# Patient Record
Sex: Female | Born: 1945
Health system: Southern US, Community
[De-identification: ages and names within clinical notes are randomized; demographics above are authoritative.]

## PROBLEM LIST (undated history)

## (undated) DIAGNOSIS — E785 Hyperlipidemia, unspecified: Secondary | ICD-10-CM

## (undated) DIAGNOSIS — T7840XA Allergy, unspecified, initial encounter: Secondary | ICD-10-CM

## (undated) DIAGNOSIS — Z8619 Personal history of other infectious and parasitic diseases: Secondary | ICD-10-CM

## (undated) DIAGNOSIS — D649 Anemia, unspecified: Secondary | ICD-10-CM

## (undated) DIAGNOSIS — K219 Gastro-esophageal reflux disease without esophagitis: Secondary | ICD-10-CM

## (undated) DIAGNOSIS — H269 Unspecified cataract: Secondary | ICD-10-CM

## (undated) HISTORY — PX: UPPER GI ENDOSCOPY: SHX6162

## (undated) HISTORY — DX: Gastro-esophageal reflux disease without esophagitis: K21.9

## (undated) HISTORY — PX: CATARACT EXTRACTION, BILATERAL: SHX1313

## (undated) HISTORY — DX: Hyperlipidemia, unspecified: E78.5

## (undated) HISTORY — DX: Allergy, unspecified, initial encounter: T78.40XA

## (undated) HISTORY — DX: Anemia, unspecified: D64.9

## (undated) HISTORY — DX: Personal history of other infectious and parasitic diseases: Z86.19

## (undated) HISTORY — DX: Unspecified cataract: H26.9

---

## 1967-10-31 HISTORY — PX: APPENDECTOMY: SHX54

## 1990-10-30 HISTORY — PX: TUBAL LIGATION: SHX77

## 1993-10-30 HISTORY — PX: OVARIAN CYST REMOVAL: SHX89

## 1999-08-15 ENCOUNTER — Other Ambulatory Visit: Admission: RE | Admit: 1999-08-15 | Discharge: 1999-08-15 | Payer: Self-pay | Admitting: Gynecology

## 1999-10-31 HISTORY — PX: BREAST BIOPSY: SHX20

## 2000-06-22 ENCOUNTER — Ambulatory Visit (HOSPITAL_COMMUNITY): Admission: RE | Admit: 2000-06-22 | Discharge: 2000-06-22 | Payer: Self-pay | Admitting: Internal Medicine

## 2000-06-22 ENCOUNTER — Encounter: Payer: Self-pay | Admitting: Internal Medicine

## 2000-08-06 ENCOUNTER — Encounter
Admission: RE | Admit: 2000-08-06 | Discharge: 2000-09-04 | Payer: Self-pay | Admitting: Physical Medicine & Rehabilitation

## 2000-08-23 ENCOUNTER — Other Ambulatory Visit: Admission: RE | Admit: 2000-08-23 | Discharge: 2000-08-23 | Payer: Self-pay | Admitting: Gynecology

## 2000-10-17 ENCOUNTER — Encounter: Payer: Self-pay | Admitting: General Surgery

## 2000-10-17 ENCOUNTER — Encounter: Admission: RE | Admit: 2000-10-17 | Discharge: 2000-10-17 | Payer: Self-pay | Admitting: General Surgery

## 2000-10-19 ENCOUNTER — Ambulatory Visit (HOSPITAL_BASED_OUTPATIENT_CLINIC_OR_DEPARTMENT_OTHER): Admission: RE | Admit: 2000-10-19 | Discharge: 2000-10-19 | Payer: Self-pay | Admitting: General Surgery

## 2000-10-19 ENCOUNTER — Encounter (INDEPENDENT_AMBULATORY_CARE_PROVIDER_SITE_OTHER): Payer: Self-pay | Admitting: *Deleted

## 2001-08-26 ENCOUNTER — Other Ambulatory Visit: Admission: RE | Admit: 2001-08-26 | Discharge: 2001-08-26 | Payer: Self-pay | Admitting: Gynecology

## 2002-08-27 ENCOUNTER — Other Ambulatory Visit: Admission: RE | Admit: 2002-08-27 | Discharge: 2002-08-27 | Payer: Self-pay | Admitting: Gynecology

## 2003-09-10 ENCOUNTER — Other Ambulatory Visit: Admission: RE | Admit: 2003-09-10 | Discharge: 2003-09-10 | Payer: Self-pay | Admitting: Gynecology

## 2004-09-14 ENCOUNTER — Other Ambulatory Visit: Admission: RE | Admit: 2004-09-14 | Discharge: 2004-09-14 | Payer: Self-pay | Admitting: Gynecology

## 2005-09-15 ENCOUNTER — Other Ambulatory Visit: Admission: RE | Admit: 2005-09-15 | Discharge: 2005-09-15 | Payer: Self-pay | Admitting: Gynecology

## 2006-06-04 ENCOUNTER — Ambulatory Visit: Payer: Self-pay | Admitting: Internal Medicine

## 2006-06-26 ENCOUNTER — Ambulatory Visit: Payer: Self-pay | Admitting: Internal Medicine

## 2006-09-17 ENCOUNTER — Other Ambulatory Visit: Admission: RE | Admit: 2006-09-17 | Discharge: 2006-09-17 | Payer: Self-pay | Admitting: Gynecology

## 2007-09-23 ENCOUNTER — Other Ambulatory Visit: Admission: RE | Admit: 2007-09-23 | Discharge: 2007-09-23 | Payer: Self-pay | Admitting: Gynecology

## 2007-12-11 ENCOUNTER — Ambulatory Visit: Payer: Self-pay | Admitting: Internal Medicine

## 2007-12-26 ENCOUNTER — Encounter: Payer: Self-pay | Admitting: Internal Medicine

## 2007-12-26 ENCOUNTER — Ambulatory Visit: Payer: Self-pay | Admitting: Internal Medicine

## 2008-09-30 ENCOUNTER — Encounter: Payer: Self-pay | Admitting: Gynecology

## 2008-09-30 ENCOUNTER — Other Ambulatory Visit: Admission: RE | Admit: 2008-09-30 | Discharge: 2008-09-30 | Payer: Self-pay | Admitting: Gynecology

## 2008-09-30 ENCOUNTER — Ambulatory Visit: Payer: Self-pay | Admitting: Gynecology

## 2009-01-26 ENCOUNTER — Ambulatory Visit: Payer: Self-pay | Admitting: Gynecology

## 2009-04-09 ENCOUNTER — Ambulatory Visit: Payer: Self-pay | Admitting: Cardiovascular Disease

## 2009-04-09 ENCOUNTER — Encounter (INDEPENDENT_AMBULATORY_CARE_PROVIDER_SITE_OTHER): Payer: Self-pay | Admitting: *Deleted

## 2009-04-09 ENCOUNTER — Ambulatory Visit: Payer: Self-pay | Admitting: Internal Medicine

## 2009-04-09 ENCOUNTER — Telehealth: Payer: Self-pay | Admitting: Internal Medicine

## 2009-04-09 DIAGNOSIS — M4120 Other idiopathic scoliosis, site unspecified: Secondary | ICD-10-CM | POA: Insufficient documentation

## 2009-04-09 DIAGNOSIS — M810 Age-related osteoporosis without current pathological fracture: Secondary | ICD-10-CM | POA: Insufficient documentation

## 2009-04-09 LAB — CONVERTED CEMR LAB
BUN: 14 mg/dL (ref 6–23)
Creatinine, Ser: 0.7 mg/dL (ref 0.4–1.2)

## 2009-04-12 ENCOUNTER — Encounter (INDEPENDENT_AMBULATORY_CARE_PROVIDER_SITE_OTHER): Payer: Self-pay | Admitting: *Deleted

## 2009-08-12 ENCOUNTER — Emergency Department (HOSPITAL_COMMUNITY): Admission: EM | Admit: 2009-08-12 | Discharge: 2009-08-12 | Payer: Self-pay | Admitting: Family Medicine

## 2009-10-01 ENCOUNTER — Other Ambulatory Visit: Admission: RE | Admit: 2009-10-01 | Discharge: 2009-10-01 | Payer: Self-pay | Admitting: Gynecology

## 2009-10-01 ENCOUNTER — Ambulatory Visit: Payer: Self-pay | Admitting: Gynecology

## 2010-02-03 ENCOUNTER — Ambulatory Visit: Payer: Self-pay | Admitting: Internal Medicine

## 2010-02-03 DIAGNOSIS — R209 Unspecified disturbances of skin sensation: Secondary | ICD-10-CM | POA: Insufficient documentation

## 2010-02-04 ENCOUNTER — Ambulatory Visit: Payer: Self-pay | Admitting: Internal Medicine

## 2010-02-04 LAB — CONVERTED CEMR LAB

## 2010-02-07 LAB — CONVERTED CEMR LAB
ALT: 24 units/L (ref 0–35)
AST: 32 units/L (ref 0–37)
Albumin: 4 g/dL (ref 3.5–5.2)
Alkaline Phosphatase: 62 units/L (ref 39–117)
BUN: 16 mg/dL (ref 6–23)
Basophils Absolute: 0 10*3/uL (ref 0.0–0.1)
Basophils Relative: 0.5 % (ref 0.0–3.0)
Bilirubin, Direct: 0.1 mg/dL (ref 0.0–0.3)
CO2: 32 meq/L (ref 19–32)
Calcium: 9.4 mg/dL (ref 8.4–10.5)
Chloride: 105 meq/L (ref 96–112)
Cholesterol: 217 mg/dL — ABNORMAL HIGH (ref 0–200)
Creatinine, Ser: 0.7 mg/dL (ref 0.4–1.2)
Direct LDL: 158.5 mg/dL
Eosinophils Absolute: 0.1 10*3/uL (ref 0.0–0.7)
Eosinophils Relative: 2.4 % (ref 0.0–5.0)
GFR calc non Af Amer: 89.91 mL/min (ref >60)
Glucose, Bld: 86 mg/dL (ref 70–99)
HCT: 38.5 % (ref 36.0–46.0)
HDL: 58.9 mg/dL (ref >39.00)
Hemoglobin: 13.4 g/dL (ref 12.0–15.0)
Lymphocytes Relative: 35.5 % (ref 12.0–46.0)
Lymphs Abs: 1.7 10*3/uL (ref 0.7–4.0)
MCHC: 34.7 g/dL (ref 30.0–36.0)
MCV: 93.2 fL (ref 78.0–100.0)
Monocytes Absolute: 0.4 10*3/uL (ref 0.1–1.0)
Monocytes Relative: 8.9 % (ref 3.0–12.0)
Neutro Abs: 2.6 10*3/uL (ref 1.4–7.7)
Neutrophils Relative %: 52.7 % (ref 43.0–77.0)
Platelets: 190 10*3/uL (ref 150.0–400.0)
Potassium: 4.1 meq/L (ref 3.5–5.1)
RBC: 4.14 M/uL (ref 3.87–5.11)
RDW: 13.1 % (ref 11.5–14.6)
Sodium: 141 meq/L (ref 135–145)
TSH: 3.4 microintl units/mL (ref 0.35–5.50)
Total Bilirubin: 0.7 mg/dL (ref 0.3–1.2)
Total CHOL/HDL Ratio: 4
Total Protein: 7.3 g/dL (ref 6.0–8.3)
Triglycerides: 51 mg/dL (ref 0.0–149.0)
VLDL: 10.2 mg/dL (ref 0.0–40.0)
Vitamin B-12: 636 pg/mL (ref 211–911)
WBC: 4.9 10*3/uL (ref 4.5–10.5)

## 2010-08-17 ENCOUNTER — Ambulatory Visit: Payer: Self-pay | Admitting: Internal Medicine

## 2010-08-22 LAB — CONVERTED CEMR LAB
Cholesterol: 220 mg/dL — ABNORMAL HIGH (ref 0–200)
Direct LDL: 143.4 mg/dL
HDL: 54.8 mg/dL (ref >39.00)
Total CHOL/HDL Ratio: 4
Triglycerides: 95 mg/dL (ref 0.0–149.0)
VLDL: 19 mg/dL (ref 0.0–40.0)

## 2010-09-13 ENCOUNTER — Ambulatory Visit: Payer: Self-pay | Admitting: Internal Medicine

## 2010-09-13 DIAGNOSIS — E785 Hyperlipidemia, unspecified: Secondary | ICD-10-CM | POA: Insufficient documentation

## 2010-09-13 LAB — CONVERTED CEMR LAB
Cholesterol, target level: 200 mg/dL
HDL goal, serum: 40 mg/dL
LDL Goal: 160 mg/dL

## 2010-10-03 ENCOUNTER — Ambulatory Visit: Payer: Self-pay | Admitting: Gynecology

## 2010-10-03 ENCOUNTER — Other Ambulatory Visit
Admission: RE | Admit: 2010-10-03 | Discharge: 2010-10-03 | Payer: Self-pay | Source: Home / Self Care | Admitting: Gynecology

## 2010-11-21 ENCOUNTER — Encounter: Payer: Self-pay | Admitting: Internal Medicine

## 2010-11-29 NOTE — Assessment & Plan Note (Signed)
Summary: TINGLING AND NUMBNESS OF RIGHT OF FACE SINCE TUESDAY/KDC   Vital Signs:  Patient profile:   66 year old female Weight:      116.0 pounds Temp:     99.1 degrees F oral Pulse rate:   72 / minute Resp:     14 per minute BP sitting:   108 / 66  (left arm) Cuff size:   regular  Vitals Entered By: Shonna Chock (February 03, 2010 1:40 PM) CC: Tingling on the right side of face since Tuesday Comments REVIEWED MED LIST, PATIENT AGREED DOSE AND INSTRUCTION CORRECT    CC:  Tingling on the right side of face since Tuesday.  History of Present Illness: Gradual progression of numbness & tingling 04/05 /2010 beginning mid morning up to a" 10". Now a "4" w/o treatment. No URI symptoms except minor rhinitis. Similar episode in 09/2009 for several which resolvesd w/o Rx.   Allergies (verified): 1)  ! Erythromycin  Review of Systems General:  Complains of sweats; denies chills, fever, and weight loss; Hot flahes. Eyes:  Denies blurring, double vision, and vision loss-both eyes. ENT:  Denies decreased hearing, difficulty swallowing, hoarseness, nasal congestion, ringing in ears, and sinus pressure; No purulence. Resp:  Complains of cough; denies sputum productive; Minor dry cough. Derm:  Denies lesion(s) and rash. Neuro:  See HPI; Denies brief paralysis, disturbances in coordination, poor balance, tremors, and weakness. Heme:  Denies abnormal bruising and bleeding. Allergy:  Complains of itching eyes; denies sneezing.  Physical Exam  General:  Thin but well-nourished,in no acute distress; alert,appropriate and cooperative throughout examination Eyes:  No corneal or conjunctival inflammation noted. EOMI. Perrla. Field of  Vision grossly normal. Ears:  External ear exam shows no significant lesions or deformities.  Otoscopic examination reveals clear canals, tympanic membranes are intact bilaterally without bulging, retraction, inflammation or discharge. Hearing is grossly normal  bilaterally. Nose:  External nasal examination shows no deformity or inflammation. Nasal mucosa are pink and moist without lesions or exudates. Mouth:  Oral mucosa and oropharynx without lesions or exudates.  Teeth in good repair. Osteoma of hard palate Neck:  No deformities, masses, or tenderness noted. Heart:  Normal rate and regular rhythm. S1 and S2 normal without gallop, murmur, click, rub.S4 Pulses:  R and L carotid pulses are full and equal bilaterallyw/o bruits Extremities:  No clubbing, cyanosis, edema. Neurologic:  alert & oriented X3, cranial nerves II-XII intact, strength normal in all extremities, sensation intact to light touch, sensation intact to pinprick, gait normal, DTRs symmetrical and normal, finger-to-nose normal, heel-to-shin normal, and Romberg negative.   Skin:  Intact without suspicious lesions or rashes Cervical Nodes:  No lymphadenopathy noted Axillary Nodes:  No palpable lymphadenopathy Psych:  memory intact for recent and remote, normally interactive, good eye contact, not anxious appearing, and not depressed appearing.     Impression & Recommendations:  Problem # 1:  FACIAL PARESTHESIA (ICD-782.0) Time interval rules out TIA; negative neuro exam  Complete Medication List: 1)  Tramadol Hcl 50 Mg Tabs (Tramadol hcl) .Marland Kitchen.. 1-2 q 6 hrs as needed pain 2)  Cyclobenzaprine Hcl 5 Mg Tabs (Cyclobenzaprine hcl) .Marland Kitchen.. 1 two times a day & 1-2 at bedtime as needed  Patient Instructions: 1)  Take a coated 325 mg  Aspirin every day.Schedule fasting labs @ [facility]: 2)  BMP ;VDRL; B12; 3)  Hepatic Panel ; 4)  Lipid Panel ; 5)  TSH ; & 6)  CBC w/ Diff prior to visit, ICD-9:782.0

## 2010-11-29 NOTE — Assessment & Plan Note (Signed)
Summary: REVIEW LABS/RH......Marland Kitchen   Vital Signs:  Patient profile:   65 year old female Height:      66.5 inches Weight:      117.2 pounds BMI:     18.70 Pulse rate:   76 / minute Resp:     14 per minute BP sitting:   120 / 62  (left arm) Cuff size:   regular  Vitals Entered By: Shonna Chock CMA (September 13, 2010 10:18 AM) CC: follow-up visit, discuss labs (patient with mailed copy), Lipid Management   CC:  follow-up visit, discuss labs (patient with mailed copy), and Lipid Management.  History of Present Illness:  Hyperlipidemia Follow-Up      This is a 65 year old woman who presents for Hyperlipidemia follow-up.  NMR reviewed, risks  & goals  discussed.She is not on statins.The patient denies the following symptoms: chest pain/pressure, exercise intolerance, dypsnea, palpitations, syncope, and pedal edema.  Dietary compliance has been good.  The patient reports exercising 3-4X per week.  Adjunctive measures currently used by the patient include ASA.    Lipid Management History:      Positive NCEP/ATP III risk factors include female age 64 years old or older and family history for ischemic heart disease (females less than 13 years old).  Negative NCEP/ATP III risk factors include no history of early menopause without estrogen hormone replacement, non-diabetic, non-tobacco-user status, non-hypertensive, no ASHD (atherosclerotic heart disease), no prior stroke/TIA, no peripheral vascular disease, and no history of aortic aneurysm.     Allergies: 1)  ! Erythromycin  Past History:  Past Medical History: Osteoporosis Scoliosis Hyperlipidemia:NMR Lipoprofile 2011: LDL 140(1480/149), HDL 59, TG 92. LDL goal = < 130 as per NMR ( ideally <100) & Framingham Study (+ FH premature CAD in P aunt).  Family History: Father: HTN,CAD Mother: CVA,HTN, scoliosis Siblings: bro: colon cancer; Paternal FH of CAD; Paunt: MI @ 2  Physical Exam  General:  Thin but  well-nourished,alert,appropriate and cooperative throughout examination Lungs:  Normal respiratory effort, chest expands symmetrically. Lungs are clear to auscultation, no crackles or wheezes. Heart:  Normal rate and regular rhythm. S1 and S2 normal without gallop, murmur, click, rub.S4  Pulses:  R and L carotid,radial,dorsalis pedis and posterior tibial pulses are full and equal bilaterally Extremities:  No clubbing, cyanosis, edema. Psych:  Oriented X3.   Focused & intelligent   Impression & Recommendations:  Problem # 1:  HYPERLIPIDEMIA (ICD-272.4)  Problem # 2:  ISCHEMIC HEART DISEASE, PREMATURE, FAMILY HX (ICD-V17.3) M aunt MI @ 3  Complete Medication List: 1)  Tramadol Hcl 50 Mg Tabs (Tramadol hcl) .Marland Kitchen.. 1-2 q 6 hrs as needed pain 2)  Cyclobenzaprine Hcl 5 Mg Tabs (Cyclobenzaprine hcl) .Marland Kitchen.. 1 two times a day & 1-2 at bedtime as needed 3)  Aspirin 325 Mg Tabs (Aspirin) .Marland Kitchen.. 1 by mouth once daily 4)  Calcium 600 Mg Tabs (Calcium) .Marland Kitchen.. 1 by mouth two times a day  Lipid Assessment/Plan:      Based on NCEP/ATP III, the patient's risk factor category is "2 or more risk factors and a calculated 10 year CAD risk of > 20%".  The patient's lipid goals are as follows: Total cholesterol goal is 200; LDL cholesterol goal is 160; HDL cholesterol goal is 40; Triglyceride goal is 150.    Patient Instructions: 1)  Please review the cardiac risks we discussed. 2)  It is important that you exercise regularly at least 20 minutes 5 times a week. If you develop chest pain,  have severe difficulty breathing, or feel very tired , stop exercising immediately and seek medical attention. 3)  Take an 81 mg coated  Aspirin every day.   Orders Added: 1)  Est. Patient Level III [47829]

## 2011-02-02 LAB — POCT RAPID STREP A (OFFICE): Streptococcus, Group A Screen (Direct): NEGATIVE

## 2011-03-17 NOTE — Op Note (Signed)
[facility]  Patient:    Sheryl Huang, Sheryl Huang                       MRN: [id number] Proc. Date: 10/19/00 Adm. Date:  [id number] Disc. Date: [id number] Attending:  Glenna Fellows Tappan                           Operative Report  PREOPERATIVE DIAGNOSIS:  Right breast mass.  POSTOPERATIVE DIAGNOSIS:  Right breast mass.  OPERATION:  Right breast biopsy.  SURGEON:  Lorne Skeens. Hoxworth, M.D.  ANESTHESIA:  Local with IV sedation.  BRIEF HISTORY:  Sheryl Huang is a 65 year old white female who presents with a persistent 2 x 1 cm palpable mass in the medial aspect of the right breast. It is smooth and somewhat soft but discrete and has persisted on followup. After discussion of options, we have elected to proceed with excisional biopsy.  The nature of the procedure, its indications and risks of bleeding, infection were discussed and understood preoperatively.   She is now brought to the operating room for this procedure  DESCRIPTION OF PROCEDURE:  The patient was brought to the operating room and placed in the supine position on the operating table.  IV sedation was administered.  The right breast was sterilely prepped and draped.  Local anesthesia was used to infiltrate the skin and underlying breast soft tissue. A curvilinear incision was made and dissection carried down through the subcutaneous tissue.  The palpable abnormality was then completely sharply excised.  Hemostasis was obtained with cautery and a figure-of-eight suture of 3-0 Vicryl.  The subcutaneous tissue was then reapproximated with interrupted 4-0 Monocryl and the skin with running subcuticular Monocryl and Steri-Strips. Sponge and needle counts were current.  Dry sterile dressing was applied, and the patient was taken to the recovery room in good condition. DD:  10/19/00 TD:  10/21/00 Job: [id number]

## 2011-04-05 ENCOUNTER — Encounter (INDEPENDENT_AMBULATORY_CARE_PROVIDER_SITE_OTHER): Payer: BC Managed Care – PPO

## 2011-04-05 DIAGNOSIS — M810 Age-related osteoporosis without current pathological fracture: Secondary | ICD-10-CM

## 2011-04-18 ENCOUNTER — Institutional Professional Consult (permissible substitution) (INDEPENDENT_AMBULATORY_CARE_PROVIDER_SITE_OTHER): Payer: BC Managed Care – PPO | Admitting: Gynecology

## 2011-04-18 DIAGNOSIS — M810 Age-related osteoporosis without current pathological fracture: Secondary | ICD-10-CM

## 2011-09-20 ENCOUNTER — Ambulatory Visit: Payer: BC Managed Care – PPO

## 2011-09-28 ENCOUNTER — Encounter: Payer: Self-pay | Admitting: *Deleted

## 2011-10-05 ENCOUNTER — Ambulatory Visit (INDEPENDENT_AMBULATORY_CARE_PROVIDER_SITE_OTHER): Payer: BC Managed Care – PPO | Admitting: Gynecology

## 2011-10-05 ENCOUNTER — Encounter: Payer: BC Managed Care – PPO | Admitting: Gynecology

## 2011-10-05 ENCOUNTER — Encounter: Payer: Self-pay | Admitting: Gynecology

## 2011-10-05 VITALS — BP 112/60 | Ht 66.0 in | Wt 114.0 lb

## 2011-10-05 DIAGNOSIS — R82998 Other abnormal findings in urine: Secondary | ICD-10-CM

## 2011-10-05 DIAGNOSIS — L259 Unspecified contact dermatitis, unspecified cause: Secondary | ICD-10-CM

## 2011-10-05 DIAGNOSIS — Z01419 Encounter for gynecological examination (general) (routine) without abnormal findings: Secondary | ICD-10-CM

## 2011-10-05 DIAGNOSIS — L309 Dermatitis, unspecified: Secondary | ICD-10-CM

## 2011-10-05 MED ORDER — BETAMETHASONE DIPROPIONATE AUG 0.05 % EX CREA: TOPICAL_CREAM | Freq: Two times a day (BID) | CUTANEOUS | Status: DC

## 2011-10-05 NOTE — Progress Notes (Signed)
Sheryl Huang [facility] [birth year] [id number]        65 y.o.  for annual exam.  Notes rash on her right breast for the last several weeks, nontender and no other areas.  Past medical history,surgical history, medications, allergies, family history and social history were all reviewed and documented in the EPIC chart. ROS:  Was performed and pertinent positives and negatives are included in the history.  Exam: chaperone present Filed Vitals:   10/05/11 0914  BP: 112/60   General appearance  Normal Skin grossly normal Head/Neck normal with no cervical or supraclavicular adenopathy thyroid normal Lungs  clear Cardiac RR, without RMG Abdominal  soft, nontender, without masses, organomegaly or hernia Breasts  examined lying and sitting without masses, retractions, discharge or axillary adenopathy.  Small erythematous patch 10:00 positions several fingerbreadths above the areola consistent with eczema no underlying palpable abnormalities. Pelvic  Ext/BUS/vagina  normal with atrophic genital changes  Cervix  normal with atrophic changes  Uterus  axial, normal size, shape and contour, midline and mobile nontender   Adnexa  Without masses or tenderness    Anus and perineum  normal   Rectovaginal  normal sphincter tone without palpated masses or tenderness.    Assessment/Plan:  65 y.o. female for annual exam.    1. Rash right breast. Appears to be eczema with no underlying palpable abnormalities and not consistent with cellulitis. Will try Diprolene cream 0.05% twice a day x1-2 weeks. Assuming it clears she'll follow, if persists I recommended she follow up with dermatology and she agrees to do so. SBE monthly reviewed. Had mammography in February and she'll continue with annual mammography. 2. Osteoporosis. DEXA last year showed osteoporosis. Per are discussion 04/18/2011 she did not want to be treated. She had been on Fosamax and Actonel for approximately 8 years total and has been off of it for the  past 2-3 years. We ultimately decided on a shorter interval study and she is to do that this coming year after the one year mark and then we'll go from there. If she has continued loss we have discussed either restarting a bisphosphate or considering alternative such as Prolia. 3. Pap smear. I did not do a Pap smear today. She has no history of abnormal Pap smears before with numerous normal records in her chart the last one in 2011. I discussed current guidelines we'll plan a less frequent screening interval to every 3 years. She will be over 65 next your I discussed possibilities of stopping altogether. 4. Colonoscopy. Patient up-to-date with colonoscopy having had one in 2009. 5. Health maintenance. Patient sees Dr. Alwyn Ren for her routine healthcare. No blood work was done today as it's all done through his office. Assuming she continues well from a  gynecologic standpoint she'll see me in a year we'll plan DEXA this coming year in follow up.    [name], 9:36 AM 10/05/2011

## 2011-10-05 NOTE — Patient Instructions (Signed)
Apply steroid cream to rash.  If not better in two weeks, follow up with dermatologist.

## 2011-10-05 NOTE — Progress Notes (Signed)
Addended byCammie Mcgee T on: 10/05/2011 10:48 AM   Modules accepted: Orders

## 2011-10-06 MED ORDER — SULFAMETHOXAZOLE-TRIMETHOPRIM 800-160 MG PO TABS: 1.0000 | ORAL_TABLET | Freq: Two times a day (BID) | ORAL | Status: AC

## 2011-10-06 NOTE — Progress Notes (Signed)
Addended by: Dara Lords on: 10/06/2011 09:14 AM   Modules accepted: Orders

## 2012-02-02 ENCOUNTER — Encounter: Payer: Self-pay | Admitting: Gynecology

## 2012-05-12 ENCOUNTER — Emergency Department (INDEPENDENT_AMBULATORY_CARE_PROVIDER_SITE_OTHER)
Admission: EM | Admit: 2012-05-12 | Discharge: 2012-05-12 | Disposition: A | Discharge status: Home / Self Care | Payer: Medicare Other | Source: Home / Self Care | Attending: Emergency Medicine | Admitting: Emergency Medicine

## 2012-05-12 ENCOUNTER — Encounter (HOSPITAL_COMMUNITY): Payer: Self-pay

## 2012-05-12 DIAGNOSIS — H8110 Benign paroxysmal vertigo, unspecified ear: Secondary | ICD-10-CM | POA: Diagnosis not present

## 2012-05-12 MED ORDER — MECLIZINE HCL 25 MG PO TABS: 25.0000 mg | ORAL_TABLET | Freq: Four times a day (QID) | ORAL | Status: DC

## 2012-05-12 MED ORDER — MECLIZINE HCL 25 MG PO TABS: 25.0000 mg | ORAL_TABLET | Freq: Four times a day (QID) | ORAL | Status: AC

## 2012-05-12 NOTE — ED Provider Notes (Signed)
History     CSN: [id number]  Arrival date & time 05/12/12  1726   First MD Initiated Contact with Patient 05/12/12 1735      Chief Complaint  Patient presents with  . Dizziness    (Consider location/radiation/quality/duration/timing/severity/associated sxs/prior treatment) HPI Comments: Patient reports episodes of acute onset, intense dizziness described as "room spinning" today. States it happens when she turns her head, or goes from lying to standing. Symptoms are worse with these movements, and better with lying still and closing her eyes. She took some Dramamine with significant  Improvement in her symptoms. She reports nausea but no vomiting while having these episodes of vertigo. She does report some sinus pressure, but no nasal congestion, postnasal drip. No headaches, photophobia, visual changes. No diaphoresis, palpitations, chest pain, shortness of breath. No presyncope or syncope. She takes 325 mg of aspirin a day, but denies taking any more than this daily. She does not take any other medications. She states that she is eating and drinking normally. No ear pain, hearing loss, tinnitus. No dysarthria, or leg weakness, difficulty walking. No history of head injury. No history of diabetes, alcohol or drug use. States she had similar symptoms several years ago, was diagnosed with an ear infection at that time.  ROS as noted in HPI. All other ROS negative.   Patient is a 66 y.o. female presenting with neurologic complaint. The history is provided by the patient.  Neurologic Problem The primary symptoms include dizziness. The symptoms began 6 to 12 hours ago.  Dizziness does not occur with tinnitus or weakness.  Additional symptoms include vertigo. Additional symptoms do not include neck stiffness, weakness, photophobia, hearing loss or tinnitus. Medical issues do not include diabetes or hypertension.    Past Medical History  Diagnosis Date  . Postmenopausal   . Osteoporosis  03/2011    t score -3.2  . Sinusitis   . Otitis media     Past Surgical History  Procedure Date  . Breast biopsy     left  . Ovarian cyst removal   . Tubal ligation   . Appendectomy     Family History  Problem Relation Age of Onset  . Hypertension Mother   . Hypertension Father   . Colon cancer Brother     History  Substance Use Topics  . Smoking status: Never Smoker   . Smokeless tobacco: Not on file  . Alcohol Use: No    OB History    Grav Para Term Preterm Abortions TAB SAB Ect Mult Living   1 1 1       1       Review of Systems  HENT: Negative for hearing loss, neck stiffness and tinnitus.   Eyes: Negative for photophobia.  Neurological: Positive for dizziness and vertigo. Negative for weakness.    Allergies  Erythromycin  Home Medications   Current Outpatient Rx  Name Route Sig Dispense Refill  . ASPIRIN 325 MG PO TABS Oral Take 325 mg by mouth daily.      Marland Kitchen CALCIUM PO Oral Take by mouth.      Marland Kitchen VITAMIN D PO Oral Take by mouth.      Marland Kitchen MECLIZINE HCL 25 MG PO TABS Oral Take 1 tablet (25 mg total) by mouth 4 (four) times daily. 28 tablet 0    BP 129/62  Pulse 66  Temp 97.9 F (36.6 C) (Oral)  Resp 16  SpO2 99%  Physical Exam  Nursing note and vitals reviewed. Constitutional: She  is oriented to person, place, and time. She appears well-developed and well-nourished. No distress.  HENT:  Head: Normocephalic and atraumatic.  Right Ear: Tympanic membrane normal.  Left Ear: Tympanic membrane normal.  Nose: Nose normal. No mucosal edema or rhinorrhea. Right sinus exhibits no maxillary sinus tenderness and no frontal sinus tenderness. Left sinus exhibits no maxillary sinus tenderness and no frontal sinus tenderness.  Mouth/Throat: Uvula is midline, oropharynx is clear and moist and mucous membranes are normal. Normal dentition.  Eyes: Conjunctivae and EOM are normal. Pupils are equal, round, and reactive to light.  Neck: Normal range of motion and full  passive range of motion without pain. Neck supple.  Cardiovascular: Normal rate, regular rhythm and normal heart sounds.   Pulmonary/Chest: Effort normal and breath sounds normal.  Abdominal: Soft. Bowel sounds are normal. She exhibits no distension.  Musculoskeletal: Normal range of motion.  Lymphadenopathy:    She has no cervical adenopathy.  Neurological: She is alert and oriented to person, place, and time. She has normal strength. She displays no tremor. No cranial nerve deficit or sensory deficit. She displays a negative Romberg sign. Coordination and gait normal.       + Dix-Hallpike right side with fatigable nystagmus. Finger->nose, heel-> shin WNL. Tandem gait steady.   Skin: Skin is warm and dry.  Psychiatric: She has a normal mood and affect. Her behavior is normal. Judgment and thought content normal.    ED Course  Procedures (including critical care time)  Labs Reviewed - No data to display No results found.   1. Benign paroxysmal positional vertigo     MDM  Normal vital signs. + dix-Hallpike right side. Otherwise neurologically intact. Patient states her symptoms improved with Dramamine. H&P most consistent with BPPV. No concern for cardiac etiology or central vertigo at this time. Home with meclizine, Epley maneuver. Discussed signs and symptoms that should prompt return to the emergency department. Patient agrees with plan.  Luiz Blare, MD 05/12/12 1902

## 2012-05-12 NOTE — ED Notes (Signed)
Pt has had lt sided sinus pressure since Friday and today she starting having dizziness and nausea.

## 2012-05-13 ENCOUNTER — Other Ambulatory Visit: Payer: Self-pay | Admitting: Gynecology

## 2012-05-13 DIAGNOSIS — M810 Age-related osteoporosis without current pathological fracture: Secondary | ICD-10-CM

## 2012-05-14 ENCOUNTER — Ambulatory Visit (INDEPENDENT_AMBULATORY_CARE_PROVIDER_SITE_OTHER): Payer: Medicare Other

## 2012-05-14 DIAGNOSIS — M810 Age-related osteoporosis without current pathological fracture: Secondary | ICD-10-CM

## 2012-05-16 ENCOUNTER — Telehealth: Payer: Self-pay | Admitting: Gynecology

## 2012-05-16 ENCOUNTER — Encounter: Payer: Self-pay | Admitting: Gynecology

## 2012-05-16 NOTE — Telephone Encounter (Signed)
Left on voicemail to make office visit with TF to discuss dexa report.

## 2012-05-16 NOTE — Telephone Encounter (Signed)
Ask patient to make an appointment to discuss her bone density results with me.

## 2012-05-21 ENCOUNTER — Ambulatory Visit (INDEPENDENT_AMBULATORY_CARE_PROVIDER_SITE_OTHER): Payer: Medicare Other | Admitting: Gynecology

## 2012-05-21 ENCOUNTER — Encounter: Payer: Self-pay | Admitting: Gynecology

## 2012-05-21 DIAGNOSIS — M810 Age-related osteoporosis without current pathological fracture: Secondary | ICD-10-CM | POA: Diagnosis not present

## 2012-05-21 NOTE — Patient Instructions (Addendum)
Follow up in 6 months for annual exam    Osteoporosis Osteoporosis is a disease of the bones that makes them weaker and prone to break (fracture). By their mid-30s, most people begin to gradually lose bone strength. If this is severe enough, osteoporosis may occur. Osteopenia is a less severe weakness of the bones, which places you at risk for osteoporosis. It is important to identify if you have osteoporosis or osteopenia. Bone fractures from osteoporosis (especially hip and spine fractures) are a major cause of hospitalization, loss of independence, and can lead to life-threatening complications. CAUSES  There are a number of causes and risk factors:  Gender. Women are at a higher risk for osteoporosis than men.   Age. Bone formation slows down with age.   Ethnicity. For unclear reasons, white and Asian women are at higher risk for osteoporosis. Hispanic and African American women are at increased, but lesser, risk.   Family history of osteoporosis can mean that you are at a higher risk for getting it.   History of bone fractures indicates you may be at higher risk of another.   Calcium is very important for bone health and strength. Not enough calcium in your diet increases your risk for osteoporosis. Vitamin D is important for calcium metabolism. You get vitamin D from sunlight, foods, or supplements.   Physical activity. Bones get stronger with weight-bearing exercise and weaker without use.   Smoking is associated with decreased bone strength.   Medicines. Cortisone medicines, too much thyroid medicine, some cancer and seizure medicines, and others can weaken bones and cause osteoporosis.   Decreased body weight is associated with osteoporosis. The small amount of estrogen-type molecules produced in fat cells seems to protect the bones.   Menopausal decrease in the hormone estrogen can cause osteoporosis.   Low levels of the hormone testosterone can cause osteoporosis.   Some  medical conditions can lead to osteoporosis (hyperthyroidism, hyperparathyroidism, B12 deficiency).  SYMPTOMS  Usually, no symptoms are felt as the bones weaken. The first symptoms are generally related to bone fractures. You may have silent, tiny bone fractures, especially in your spine. This can cause height loss and forward bending of the spine (kyphosis). DIAGNOSIS  You or your caregiver may suspect osteoporosis based on height loss and kyphosis. Osteoporosis or osteopenia may be identified on an X-ray done for other reasons. A bone density measurement will likely be taken. Your bones are often measured at your lower spine or your hips. Measurement is done by an X-ray called a DEXA scan, or sometimes by a computerized X-ray scan (CT or CAT scan). Other tests may be done to find the cause of osteoporosis, such as blood tests to measure calcium and vitamin D, or to monitor treatment. TREATMENT  The goal of osteoporosis treatment is to prevent fractures. This is done through medicine and home care treatments. Treatment will slow the weakening of your bones and strengthen them where possible. Measures to decrease the likelihood of falling and fracturing a bone are also important. Medicine  You may need supplements if you are not getting enough calcium, vitamin D, and vitamin B12.   If you are female and menopausal, you should discuss the option of estrogen replacement or estrogen-like medicine with your caregiver.   Medicines can be taken by mouth or injection to help build bone strength. When taken by mouth, there are important directions that you need to follow.   Calcitonin is a hormone made by the thyroid gland that can help  build bone strength and decrease fracture risk in the spine. It can be taken by nasal spray or injection.   Parathyroid hormone can be injected to help build bone strength.   You will need to continue to get enough calcium intake with any of these medicines.  FALL  PREVENTION  If you are unsteady on your feet, use a cane, walker, or walk with someone's help.   Remove loose rugs or electrical cords from your home.   Keep your home well lit at night. Use glasses if you need them.   Avoid icy streets and wet or waxed floors.   Hold the railing when using stairs.   Watch out for your pets.   Install grab bars in your bathroom.   Exercise. Physical activity, especially weight-bearing exercise, helps strengthen bones. Strength and balance exercise, such as tai chi, helps prevent falls.   Alcohol and some medicines can make you more likely to fall. Discuss alcohol use with your caregiver. Ask your caregiver if any of your medicines might increase your risk for falling. Ask if safer alternatives are available.  HOME CARE INSTRUCTIONS   Try to prevent and avoid falls.   To pick up objects, bend at the knees. Do not bend with your back.   Do not smoke. If you smoke, ask for help to stop.   Have adequate calcium and vitamin D in your diet. Talk with your caregiver about amounts.   Before exercising, ask your caregiver what exercises will be good for you.   Only take over-the-counter or prescription medicines for pain, discomfort, or fever as directed by your caregiver.  SEEK MEDICAL CARE IF:   You have had a fracture and your pain is not controlled.   You have had a fracture and you are not able to return to activities as expected.   You are reinjured.   You develop side effects from medicines, especially stomach pain or trouble swallowing.   You develop new, unexplained problems.  SEEK IMMEDIATE MEDICAL CARE IF:   You develop sudden, severe pain in your back.   You develop pain after an injury or fall.  Document Released: 07/26/2005 Document Revised: 10/05/2011 Document Reviewed: 09/30/2011 [facility] Patient Information 2012 [place], [place].

## 2012-05-21 NOTE — Progress Notes (Signed)
Patient presents to discuss her recent DEXA study 05/14/2012. T score -3.2. She is known to be osteoporotic and had been on Fosamax/Actonel for a total of 8 years. Discontinued after December 2010. Comparison of current DEXA to prior study 03/2011 shows no statistically significant change. When compared to baseline 2008 there is a statistically significant decline in the right hip but spine and left hip are stable. We have discussed in the past the issues of drug-free holiday in a patient with osteoporosis in the risks of bisphosphonate treatment to include osteonecrosis of the jaw, atypical fractures, esophageal cancer and reflux. The issue that although her bone density is stable she still is considered at increased risk of fracture given the T score in whether treatment now to prevent fracture versus continued observation appropriate. She is retired now she is more active in exercising with increased weightbearing. We'll check baseline vitamin D today. After lengthy discussion the patient declines treatment at present she understands the risk of continue bone loss and we'll re test her in 2 years and then go from there.

## 2012-05-22 LAB — VITAMIN D 25 HYDROXY (VIT D DEFICIENCY, FRACTURES): Vit D, 25-Hydroxy: 38 ng/mL (ref 30–89)

## 2012-05-27 ENCOUNTER — Encounter: Payer: Self-pay | Admitting: Gynecology

## 2012-07-25 ENCOUNTER — Telehealth: Payer: Self-pay | Admitting: Internal Medicine

## 2012-07-25 MED ORDER — ZOSTER VACCINE LIVE 19400 UNT/0.65ML ~~LOC~~ SOLR: 0.6500 mL | Freq: Once | SUBCUTANEOUS | Status: DC

## 2012-07-25 NOTE — Telephone Encounter (Signed)
Per call from pt.@ 113pm 07/25/12 - wants shingles vaccine administered at pharmacy, uses [facility] on [address] Please call back once sent at [phone]

## 2012-08-15 DIAGNOSIS — Z23 Encounter for immunization: Secondary | ICD-10-CM | POA: Diagnosis not present

## 2012-10-08 ENCOUNTER — Ambulatory Visit (INDEPENDENT_AMBULATORY_CARE_PROVIDER_SITE_OTHER): Payer: Medicare Other | Admitting: Gynecology

## 2012-10-08 ENCOUNTER — Encounter: Payer: Self-pay | Admitting: Gynecology

## 2012-10-08 VITALS — BP 106/72 | Ht 66.25 in | Wt 114.0 lb

## 2012-10-08 DIAGNOSIS — N952 Postmenopausal atrophic vaginitis: Secondary | ICD-10-CM

## 2012-10-08 DIAGNOSIS — M810 Age-related osteoporosis without current pathological fracture: Secondary | ICD-10-CM | POA: Diagnosis not present

## 2012-10-08 DIAGNOSIS — N814 Uterovaginal prolapse, unspecified: Secondary | ICD-10-CM | POA: Diagnosis not present

## 2012-10-08 NOTE — Patient Instructions (Signed)
Follow up one year for exam.

## 2012-10-08 NOTE — Progress Notes (Signed)
Kamylle [facility]elson [facility] 1946-07-02 [id number]        66 y.o.  G1P1001 for annual follow up exam.    Past medical history,surgical history, medications, allergies, family history and social history were all reviewed and documented in the EPIC chart. ROS:  Was performed and pertinent positives and negatives are included in the history.  Exam: Sherrilyn Rist assistant Filed Vitals:   10/08/12 0854  BP: 106/72  Height: 5' 6.25" (1.683 m)  Weight: 114 lb (51.71 kg)   General appearance  Normal Skin grossly normal Head/Neck normal with no cervical or supraclavicular adenopathy thyroid normal Lungs  clear Cardiac RR, without RMG Abdominal  soft, nontender, without masses, organomegaly or hernia Breasts  examined lying and sitting without masses, retractions, discharge or axillary adenopathy. Pelvic  Ext/BUS/vagina  normal with atrophic changes  Cervix  normal with atrophic changes mild prolapse  Uterus  Anteverted with mild descent, normal size, shape and contour, midline and mobile nontender   Adnexa  Without masses or tenderness    Anus and perineum  normal   Rectovaginal  normal sphincter tone without palpated masses or tenderness.    Assessment/Plan:  66 y.o. G70P1001 female for follow up exam.   1. Mild uterine prolapse. Patient asymptomatic. Will monitor annually. 2. Postmenopausal/atrophic changes. Patient asymptomatic. We'll monitor. 3. Osteoporosis.  DEXA 04/2012 with T score -3.2. See 05/21/2012 discussion. Planned repeat DEXA a two-year interval.  Increased calcium vitamin D reviewed. Vitamin D level normal 04/2012. 4. Mammography 12/2011. Continued annual mammography. SBE monthly reviewed. 5. Pap smear 09/2010. No Pap smear done today. No history of significant abnormal Pap smears. Plan repeat next year at 3 year interval. Options to stop screening altogether she is 66 discussed. We'll readdress next year. 6. Colonoscopy 2009. Repeated there recommended interval. Health maintenance. No lab work  done as it was all done through Dr. Frederik Pear office. Follow up one year, sooner as needed.    [name], 9:25 AM 10/08/2012

## 2012-10-09 LAB — URINALYSIS W MICROSCOPIC + REFLEX CULTURE
Casts: NONE SEEN
Glucose, UA: NEGATIVE mg/dL
Nitrite: NEGATIVE
Squamous Epithelial / LPF: NONE SEEN
pH: 6 (ref 5.0–8.0)

## 2012-10-10 ENCOUNTER — Telehealth: Payer: Self-pay | Admitting: Gynecology

## 2012-10-10 MED ORDER — CIPROFLOXACIN HCL 250 MG PO TABS: 250.0000 mg | ORAL_TABLET | Freq: Two times a day (BID) | ORAL | Status: DC

## 2012-10-10 NOTE — Telephone Encounter (Signed)
Tell patient she has low level bacteria in her urine I want to cover her with an antibiotic. Ciprofloxacin 250 mg twice a day x3 days.

## 2012-10-10 NOTE — Telephone Encounter (Signed)
Left message patient to call me for result.

## 2012-10-11 ENCOUNTER — Telehealth: Payer: Self-pay | Admitting: Gynecology

## 2012-10-11 LAB — URINE CULTURE: Colony Count: 80000

## 2012-10-11 NOTE — Telephone Encounter (Signed)
Patient informed regarded urine results and need for Rx. Rx is at pharmacy.

## 2012-10-30 HISTORY — PX: COLONOSCOPY: SHX174

## 2012-11-14 NOTE — Telephone Encounter (Signed)
See telephone encounter on 10/11/12 Sheryl Huang

## 2013-01-10 ENCOUNTER — Encounter: Payer: Self-pay | Admitting: Internal Medicine

## 2013-01-15 ENCOUNTER — Encounter: Payer: Self-pay | Admitting: Internal Medicine

## 2013-01-27 DIAGNOSIS — Z1231 Encounter for screening mammogram for malignant neoplasm of breast: Secondary | ICD-10-CM | POA: Diagnosis not present

## 2013-01-29 ENCOUNTER — Encounter: Payer: Self-pay | Admitting: Gynecology

## 2013-02-17 ENCOUNTER — Ambulatory Visit (AMBULATORY_SURGERY_CENTER): Payer: Medicare Other | Admitting: *Deleted

## 2013-02-17 ENCOUNTER — Encounter: Payer: Self-pay | Admitting: *Deleted

## 2013-02-17 VITALS — Ht 67.0 in | Wt 115.0 lb

## 2013-02-17 DIAGNOSIS — Z1211 Encounter for screening for malignant neoplasm of colon: Secondary | ICD-10-CM

## 2013-02-17 MED ORDER — MOVIPREP 100 G PO SOLR: ORAL | Status: DC

## 2013-02-18 ENCOUNTER — Telehealth: Payer: Self-pay | Admitting: Internal Medicine

## 2013-02-18 NOTE — Telephone Encounter (Signed)
Talked with pt and mailed her voucher for free MoviPrep.

## 2013-02-28 ENCOUNTER — Ambulatory Visit (AMBULATORY_SURGERY_CENTER): Payer: Medicare Other | Admitting: Internal Medicine

## 2013-02-28 ENCOUNTER — Encounter: Payer: Self-pay | Admitting: Internal Medicine

## 2013-02-28 VITALS — BP 103/66 | HR 75 | Temp 97.3°F | Resp 16 | Ht 67.0 in | Wt 115.0 lb

## 2013-02-28 DIAGNOSIS — Z800 Family history of malignant neoplasm of digestive organs: Secondary | ICD-10-CM

## 2013-02-28 DIAGNOSIS — E785 Hyperlipidemia, unspecified: Secondary | ICD-10-CM | POA: Diagnosis not present

## 2013-02-28 DIAGNOSIS — Z1211 Encounter for screening for malignant neoplasm of colon: Secondary | ICD-10-CM

## 2013-02-28 DIAGNOSIS — M4120 Other idiopathic scoliosis, site unspecified: Secondary | ICD-10-CM | POA: Diagnosis not present

## 2013-02-28 DIAGNOSIS — R209 Unspecified disturbances of skin sensation: Secondary | ICD-10-CM | POA: Diagnosis not present

## 2013-02-28 MED ORDER — SODIUM CHLORIDE 0.9 % IV SOLN: 500.0000 mL | INTRAVENOUS | Status: DC

## 2013-02-28 NOTE — Patient Instructions (Addendum)
Impressions/Recommendations:  Normal colon  Repeat colonoscopy in 5 years.  YOU HAD AN ENDOSCOPIC PROCEDURE TODAY AT THE [facility]: Refer to the procedure report that was given to you for any specific questions about what was found during the examination.  If the procedure report does not answer your questions, please call your gastroenterologist to clarify.  If you requested that your care partner not be given the details of your procedure findings, then the procedure report has been included in a sealed envelope for you to review at your convenience later.  YOU SHOULD EXPECT: Some feelings of bloating in the abdomen. Passage of more gas than usual.  Walking can help get rid of the air that was put into your GI tract during the procedure and reduce the bloating. If you had a lower endoscopy (such as a colonoscopy or flexible sigmoidoscopy) you may notice spotting of blood in your stool or on the toilet paper. If you underwent a bowel prep for your procedure, then you may not have a normal bowel movement for a few days.  DIET: Your first meal following the procedure should be a light meal and then it is ok to progress to your normal diet.  A half-sandwich or bowl of soup is an example of a good first meal.  Heavy or fried foods are harder to digest and may make you feel nauseous or bloated.  Likewise meals heavy in dairy and vegetables can cause extra gas to form and this can also increase the bloating.  Drink plenty of fluids but you should avoid alcoholic beverages for 24 hours.  ACTIVITY: Your care partner should take you home directly after the procedure.  You should plan to take it easy, moving slowly for the rest of the day.  You can resume normal activity the day after the procedure however you should NOT DRIVE or use heavy machinery for 24 hours (because of the sedation medicines used during the test).    SYMPTOMS TO REPORT IMMEDIATELY: A gastroenterologist can be reached at any  hour.  During normal business hours, 8:30 AM to 5:00 PM Monday through Friday, call [phone].  After hours and on weekends, please call the GI answering service at [phone] who will take a message and have the physician on call contact you.   Following lower endoscopy (colonoscopy or flexible sigmoidoscopy):  Excessive amounts of blood in the stool  Significant tenderness or worsening of abdominal pains  Swelling of the abdomen that is new, acute  Fever of 100F or higher   FOLLOW UP: If any biopsies were taken you will be contacted by phone or by letter within the next 1-3 weeks.  Call your gastroenterologist if you have not heard about the biopsies in 3 weeks.  Our staff will call the home number listed on your records the next business day following your procedure to check on you and address any questions or concerns that you may have at that time regarding the information given to you following your procedure. This is a courtesy call and so if there is no answer at the home number and we have not heard from you through the emergency physician on call, we will assume that you have returned to your regular daily activities without incident.  SIGNATURES/CONFIDENTIALITY: You and/or your care partner have signed paperwork which will be entered into your electronic medical record.  These signatures attest to the fact that that the information above on your After Visit Summary has been  reviewed and is understood.  Full responsibility of the confidentiality of this discharge information lies with you and/or your care-partner. 

## 2013-02-28 NOTE — Op Note (Signed)
[facility] [address] [place], [address]   COLONOSCOPY PROCEDURE REPORT  PATIENT: Sheryl Huang, Sheryl Huang  MR#: [id number] BIRTHDATE: 03/05/1946 , 66  yrs. old GENDER: Female ENDOSCOPIST: Hart Carwin, MD REFERRED BY:  recall colonoscopy PROCEDURE DATE:  02/28/2013 PROCEDURE:   Colonoscopy, screening ASA CLASS:   Class II INDICATIONS:Patient's immediate family history of colon cancer and prior colonoscopy 2004,2009, brother with colon Ca at 35. MEDICATIONS: MAC sedation, administered by CRNA and propofol (Diprivan) 250mg  IV  DESCRIPTION OF PROCEDURE:   After the risks and benefits and of the procedure were explained, informed consent was obtained.  A digital rectal exam revealed no abnormalities of the rectum.    The LB [id number]-[id number]  endoscope was introduced through the anus and advanced to the cecum, which was identified by both the appendix and ileocecal valve .  The quality of the prep was adequate. .  The instrument was then slowly withdrawn as the colon was fully examined.     COLON FINDINGS: A normal appearing cecum, ileocecal valve, and appendiceal orifice were identified.  The ascending, hepatic flexure, transverse, splenic flexure, descending, sigmoid colon and rectum appeared unremarkable.  No polyps or cancers were seen. Retroflexed views revealed no abnormalities.     The scope was then withdrawn from the patient and the procedure completed.  COMPLICATIONS: There were no complications. ENDOSCOPIC IMPRESSION: Normal colon  RECOMMENDATIONS: High fiber diet   REPEAT EXAM: In 5 year(s)  for Colonoscopy.  cc:  _______________________________ eSignedHart Carwin, MD 02/28/2013 10:25 AM

## 2013-02-28 NOTE — Progress Notes (Signed)
Report to pacu rn, vss, bbs=clear 

## 2013-02-28 NOTE — Progress Notes (Signed)
Patient did not experience any of the following events: a burn prior to discharge; a fall within the facility; wrong site/side/patient/procedure/implant event; or a hospital transfer or hospital admission upon discharge from the facility. (G8907) Patient did not have preoperative order for IV antibiotic SSI prophylaxis. (G8918)  

## 2013-03-03 ENCOUNTER — Telehealth: Payer: Self-pay | Admitting: *Deleted

## 2013-03-03 NOTE — Telephone Encounter (Signed)
  Follow up Call-  Call back number 02/28/2013  Post procedure Call Back phone  # [phone]  Permission to leave phone message Yes     Patient questions:  Do you have a fever, pain , or abdominal swelling? no Pain Score  0 *  Have you tolerated food without any problems? yes  Have you been able to return to your normal activities? yes  Do you have any questions about your discharge instructions: Diet   no Medications  no Follow up visit  no  Do you have questions or concerns about your Care? no  Actions: * If pain score is 4 or above: No action needed, pain <4.

## 2013-03-11 DIAGNOSIS — H2510 Age-related nuclear cataract, unspecified eye: Secondary | ICD-10-CM | POA: Diagnosis not present

## 2013-03-11 DIAGNOSIS — H40019 Open angle with borderline findings, low risk, unspecified eye: Secondary | ICD-10-CM | POA: Diagnosis not present

## 2013-03-11 DIAGNOSIS — H0230 Blepharochalasis unspecified eye, unspecified eyelid: Secondary | ICD-10-CM | POA: Diagnosis not present

## 2013-06-09 ENCOUNTER — Encounter: Payer: Self-pay | Admitting: Internal Medicine

## 2013-06-09 ENCOUNTER — Ambulatory Visit (INDEPENDENT_AMBULATORY_CARE_PROVIDER_SITE_OTHER): Payer: Medicare Other | Admitting: Internal Medicine

## 2013-06-09 VITALS — BP 102/58 | HR 79 | Temp 97.8°F | Ht 67.0 in | Wt 115.5 lb

## 2013-06-09 DIAGNOSIS — B029 Zoster without complications: Secondary | ICD-10-CM | POA: Diagnosis not present

## 2013-06-09 MED ORDER — VALACYCLOVIR HCL 1 G PO TABS: 1000.0000 mg | ORAL_TABLET | Freq: Three times a day (TID) | ORAL | Status: DC

## 2013-06-09 MED ORDER — GABAPENTIN 100 MG PO CAPS: 100.0000 mg | ORAL_CAPSULE | Freq: Three times a day (TID) | ORAL | Status: DC

## 2013-06-09 NOTE — Progress Notes (Signed)
Subjective:    Patient ID: Sheryl Huang, female    DOB: Nov 16, 1945, 67 y.o.   MRN: [id number]  HPI  Pt presents to the clinic today with c/o a rash around her waist. She noticed this on Friday. It has spread around to her back. There is some sensitivity, burning, tingling and itchy. She has never had a rash like this in the past. She did have chicken pox as a child but never had shingles. She did have the shingles vaccine in 2013.   Review of Systems      Past Medical History  Diagnosis Date  . Osteoporosis 05/14/2012    T score -3.2    Current Outpatient Prescriptions  Medication Sig Dispense Refill  . aspirin 325 MG tablet Take 325 mg by mouth daily.        . Calcium Carbonate-Vitamin D (CALCIUM 600 + D PO) Take 1 tablet by mouth 2 (two) times daily.      . Flaxseed, Linseed, (FLAX SEED OIL PO) Take 1 tablet by mouth daily.      Marland Kitchen POTASSIUM PO Take 1 tablet by mouth daily.        No current facility-administered medications for this visit.    Allergies  Allergen Reactions  . Erythromycin     REACTION: SEVERE GI UPSET    Family History  Problem Relation Age of Onset  . Hypertension Mother   . Hypertension Father   . Colon cancer Brother     History   Social History  . Marital Status: Married    Spouse Name: N/A    Number of Children: N/A  . Years of Education: N/A   Occupational History  . Not on file.   Social History Main Topics  . Smoking status: Never Smoker   . Smokeless tobacco: Never Used  . Alcohol Use: No  . Drug Use: No  . Sexually Active: Yes    Birth Control/ Protection: Post-menopausal, Surgical   Other Topics Concern  . Not on file   Social History Narrative  . No narrative on file     Constitutional: Denies fever, malaise, fatigue, headache or abrupt weight changes.  Skin: Pt reports rash. Denies redness, rashes, lesions or ulcercations.     No other specific complaints in a complete review of systems (except as listed in  HPI above).  Objective:   Physical Exam   BP 102/58  Pulse 79  Temp(Src) 97.8 F (36.6 C) (Oral)  Ht 5\' 7"  (1.702 m)  Wt 115 lb 8 oz (52.39 kg)  BMI 18.09 kg/m2  SpO2 96% Wt Readings from Last 3 Encounters:  06/09/13 115 lb 8 oz (52.39 kg)  02/28/13 115 lb (52.164 kg)  02/17/13 115 lb (52.164 kg)    General: Appears her stated age, well developed, well nourished in NAD. Skin: Warm, dry and intact. Vesicular lesion on erythematous base noted in dermatome formation. Cardiovascular: Normal rate and rhythm. S1,S2 noted.  No murmur, rubs or gallops noted. No JVD or BLE edema. No carotid bruits noted. Pulmonary/Chest: Normal effort and positive vesicular breath sounds. No respiratory distress. No wheezes, rales or ronchi noted.    BMET    Component Value Date/Time   NA 141 02/04/2010 0740   K 4.1 02/04/2010 0740   CL 105 02/04/2010 0740   CO2 32 02/04/2010 0740   GLUCOSE 86 02/04/2010 0740   BUN 16 02/04/2010 0740   CREATININE 0.7 02/04/2010 0740   CALCIUM 9.4 02/04/2010 0740   GFRNONAA 89.91  02/04/2010 0740    Lipid Panel     Component Value Date/Time   CHOL 220* 08/17/2010 0802   TRIG 95.0 08/17/2010 0802   HDL 54.80 08/17/2010 0802   CHOLHDL 4 08/17/2010 0802   VLDL 19.0 08/17/2010 0802    CBC    Component Value Date/Time   WBC 4.9 02/04/2010 0740   RBC 4.14 02/04/2010 0740   HGB 13.4 02/04/2010 0740   HCT 38.5 02/04/2010 0740   PLT 190.0 02/04/2010 0740   MCV 93.2 02/04/2010 0740   MCHC 34.7 02/04/2010 0740   RDW 13.1 02/04/2010 0740   LYMPHSABS 1.7 02/04/2010 0740   MONOABS 0.4 02/04/2010 0740   EOSABS 0.1 02/04/2010 0740   BASOSABS 0.0 02/04/2010 0740    Hgb A1C No results found for this basename: HGBA1C        Assessment & Plan:   Shingles of left waist, new onset:  eRx for valacyclovir 1 gm TID x 7 days eRx for Neurontin 100 mg TID x 7 days  RTC as needed or if symptoms persist or worsen

## 2013-06-09 NOTE — Patient Instructions (Signed)
Shingles Shingles (herpes zoster) is an infection that is caused by the same virus that causes chickenpox (varicella). The infection causes a painful skin rash and fluid-filled blisters, which eventually break open, crust over, and heal. It may occur in any area of the body, but it usually affects only one side of the body or face. The pain of shingles usually lasts about 1 month. However, some people with shingles may develop long-term (chronic) pain in the affected area of the body. Shingles often occurs many years after the person had chickenpox. It is more common:  In people older than 50 years.  In people with weakened immune systems, such as those with HIV, AIDS, or cancer.  In people taking medicines that weaken the immune system, such as transplant medicines.  In people under great stress. CAUSES  Shingles is caused by the varicella zoster virus (VZV), which also causes chickenpox. After a person is infected with the virus, it can remain in the person's body for years in an inactive state (dormant). To cause shingles, the virus reactivates and breaks out as an infection in a nerve root. The virus can be spread from person to person (contagious) through contact with open blisters of the shingles rash. It will only spread to people who have not had chickenpox. When these people are exposed to the virus, they may develop chickenpox. They will not develop shingles. Once the blisters scab over, the person is no longer contagious and cannot spread the virus to others. SYMPTOMS  Shingles shows up in stages. The initial symptoms may be pain, itching, and tingling in an area of the skin. This pain is usually described as burning, stabbing, or throbbing.In a few days or weeks, a painful red rash will appear in the area where the pain, itching, and tingling were felt. The rash is usually on one side of the body in a band or belt-like pattern. Then, the rash usually turns into fluid-filled blisters. They  will scab over and dry up in approximately 2 3 weeks. Flu-like symptoms may also occur with the initial symptoms, the rash, or the blisters. These may include:  Fever.  Chills.  Headache.  Upset stomach. DIAGNOSIS  Your caregiver will perform a skin exam to diagnose shingles. Skin scrapings or fluid samples may also be taken from the blisters. This sample will be examined under a microscope or sent to a lab for further testing. TREATMENT  There is no specific cure for shingles. Your caregiver will likely prescribe medicines to help you manage the pain, recover faster, and avoid long-term problems. This may include antiviral drugs, anti-inflammatory drugs, and pain medicines. HOME CARE INSTRUCTIONS   Take a cool bath or apply cool compresses to the area of the rash or blisters as directed. This may help with the pain and itching.   Only take over-the-counter or prescription medicines as directed by your caregiver.   Rest as directed by your caregiver.  Keep your rash and blisters clean with mild soap and cool water or as directed by your caregiver.  Do not pick your blisters or scratch your rash. Apply an anti-itch cream or numbing creams to the affected area as directed by your caregiver.  Keep your shingles rash covered with a loose bandage (dressing).  Avoid skin contact with:  Babies.   Pregnant women.   Children with eczema.   Elderly people with transplants.   People with chronic illnesses, such as leukemia or AIDS.   Wear loose-fitting clothing to help ease   the pain of material rubbing against the rash.  Keep all follow-up appointments with your caregiver.If the area involved is on your face, you may receive a referral for follow-up to a specialist, such as an eye doctor (ophthalmologist) or an ear, nose, and throat (ENT) doctor. Keeping all follow-up appointments will help you avoid eye complications, chronic pain, or disability.  SEEK IMMEDIATE MEDICAL  CARE IF:   You have facial pain, pain around the eye area, or loss of feeling on one side of your face.  You have ear pain or ringing in your ear.  You have loss of taste.  Your pain is not relieved with prescribed medicines.   Your redness or swelling spreads.   You have more pain and swelling.  Your condition is worsening or has changed.   You have a feveror persistent symptoms for more than 2 3 days.  You have a fever and your symptoms suddenly get worse. MAKE SURE YOU:  Understand these instructions.  Will watch your condition.  Will get help right away if you are not doing well or get worse. Document Released: 10/16/2005 Document Revised: 07/10/2012 Document Reviewed: 05/30/2012 Exit[employer] Patient Information 2014 [employer].  

## 2013-08-14 DIAGNOSIS — Z23 Encounter for immunization: Secondary | ICD-10-CM | POA: Diagnosis not present

## 2013-09-29 DIAGNOSIS — Z23 Encounter for immunization: Secondary | ICD-10-CM | POA: Diagnosis not present

## 2013-10-16 ENCOUNTER — Ambulatory Visit (INDEPENDENT_AMBULATORY_CARE_PROVIDER_SITE_OTHER): Payer: Medicare Other | Admitting: Gynecology

## 2013-10-16 ENCOUNTER — Other Ambulatory Visit (HOSPITAL_COMMUNITY)
Admission: RE | Admit: 2013-10-16 | Discharge: 2013-10-16 | Disposition: A | Discharge status: Home / Self Care | Payer: Medicare Other | Source: Ambulatory Visit | Attending: Gynecology | Admitting: Gynecology

## 2013-10-16 ENCOUNTER — Encounter: Payer: Self-pay | Admitting: Gynecology

## 2013-10-16 VITALS — BP 120/76 | Ht 67.0 in | Wt 116.0 lb

## 2013-10-16 DIAGNOSIS — N952 Postmenopausal atrophic vaginitis: Secondary | ICD-10-CM | POA: Diagnosis not present

## 2013-10-16 DIAGNOSIS — M810 Age-related osteoporosis without current pathological fracture: Secondary | ICD-10-CM

## 2013-10-16 DIAGNOSIS — Z124 Encounter for screening for malignant neoplasm of cervix: Secondary | ICD-10-CM | POA: Diagnosis not present

## 2013-10-16 LAB — URINALYSIS W MICROSCOPIC + REFLEX CULTURE
Bacteria, UA: NONE SEEN
Bilirubin Urine: NEGATIVE
Casts: NONE SEEN
Crystals: NONE SEEN
Ketones, ur: NEGATIVE mg/dL
Nitrite: NEGATIVE
Specific Gravity, Urine: 1.005 (ref 1.005–1.030)
Squamous Epithelial / LPF: NONE SEEN
Urobilinogen, UA: 0.2 mg/dL (ref 0.0–1.0)
pH: 6.5 (ref 5.0–8.0)

## 2013-10-16 NOTE — Patient Instructions (Signed)
Followup for bone density the fall of 2015. Followup for annual exam in one year.

## 2013-10-16 NOTE — Progress Notes (Signed)
Sheryl Huang [facility] [birth year] [id number]        67 y.o.  G1P1001 for followup exam.  Several issues that are below.  Past medical history,surgical history, problem list, medications, allergies, family history and social history were all reviewed and documented in the EPIC chart.  ROS:  Performed and pertinent positives and negatives are included in the history, assessment and plan .  Exam: [name] Filed Vitals:   10/16/13 0928  BP: 120/76  Height: 5\' 7"  (1.702 m)  Weight: 116 lb (52.617 kg)   General appearance  Normal Skin grossly normal Head/Neck normal with no cervical or supraclavicular adenopathy thyroid normal Lungs  clear Cardiac RR, without RMG Abdominal  soft, nontender, without masses, organomegaly or hernia Breasts  examined lying and sitting without masses, retractions, discharge or axillary adenopathy. Pelvic  Ext/BUS/vagina  general atrophic changes  Cervix  atrophic. Pap done   Uterus  anteverted, normal size, shape and contour, midline and mobile nontender   Adnexa  Without masses or tenderness    Anus and perineum  Normal   Rectovaginal  Normal sphincter tone without palpated masses or tenderness.    Assessment/Plan:  67 y.o. G1P1001 female for followup exam.   1. Postmenopausal/atrophic genital changes. Without significant symptoms of hot flushes, night sweats, vaginal dryness or dyspareunia. No vaginal bleeding. Will continue to monitor. Call if any vaginal bleeding. 2. Osteoporosis. DEXA 04/2012 with T score -3.2.  See 05/21/2012 discussion. Had been on Fosamax/Actonel for 8 years discontinued in 2010. DEXA appears stable from prior study. Patient elected for no treatment at this time.   Plan repeat DEXA 2015. Increase calcium vitamin D reviewed. 3. Pap smear 2011. Pap smear done today. No history of abnormal Pap smears previously. Options to stop screening altogether as she is over the age of 17 versus less frequent screening intervals reviewed. Will  readdress on an annual basis. 4. Mammography 12/2012. Continue with annual mammography. SBE monthly reviewed. 5. Colonoscopy 2014. Repeat at their recommended interval. 6. Health maintenance. No blood work done as this is done through her primary physician's office. Followup one year, sooner as needed.    Note: This document was prepared with digital dictation and possible smart phrase technology. Any transcriptional errors that result from this process are unintentional.   Sheryl Lords MD, 9:53 AM 10/16/2013

## 2013-10-16 NOTE — Addendum Note (Signed)
Addended by: Dayna Barker on: 10/16/2013 10:20 AM   Modules accepted: Orders

## 2013-11-05 DIAGNOSIS — J40 Bronchitis, not specified as acute or chronic: Secondary | ICD-10-CM | POA: Diagnosis not present

## 2013-11-05 DIAGNOSIS — J329 Chronic sinusitis, unspecified: Secondary | ICD-10-CM | POA: Diagnosis not present

## 2014-01-06 DIAGNOSIS — R059 Cough, unspecified: Secondary | ICD-10-CM | POA: Diagnosis not present

## 2014-01-06 DIAGNOSIS — J309 Allergic rhinitis, unspecified: Secondary | ICD-10-CM | POA: Diagnosis not present

## 2014-01-06 DIAGNOSIS — R05 Cough: Secondary | ICD-10-CM | POA: Diagnosis not present

## 2014-01-29 DIAGNOSIS — Z1231 Encounter for screening mammogram for malignant neoplasm of breast: Secondary | ICD-10-CM | POA: Diagnosis not present

## 2014-02-09 ENCOUNTER — Encounter: Payer: Self-pay | Admitting: Gynecology

## 2014-03-12 DIAGNOSIS — H02839 Dermatochalasis of unspecified eye, unspecified eyelid: Secondary | ICD-10-CM | POA: Diagnosis not present

## 2014-03-12 DIAGNOSIS — H1045 Other chronic allergic conjunctivitis: Secondary | ICD-10-CM | POA: Diagnosis not present

## 2014-03-12 DIAGNOSIS — H2510 Age-related nuclear cataract, unspecified eye: Secondary | ICD-10-CM | POA: Diagnosis not present

## 2014-03-12 DIAGNOSIS — H04129 Dry eye syndrome of unspecified lacrimal gland: Secondary | ICD-10-CM | POA: Diagnosis not present

## 2014-03-12 DIAGNOSIS — H40019 Open angle with borderline findings, low risk, unspecified eye: Secondary | ICD-10-CM | POA: Diagnosis not present

## 2014-03-28 ENCOUNTER — Encounter: Payer: Self-pay | Admitting: Internal Medicine

## 2014-03-28 DIAGNOSIS — H543 Unqualified visual loss, both eyes: Secondary | ICD-10-CM | POA: Insufficient documentation

## 2014-04-06 ENCOUNTER — Encounter: Payer: Self-pay | Admitting: Internal Medicine

## 2014-04-06 ENCOUNTER — Ambulatory Visit (INDEPENDENT_AMBULATORY_CARE_PROVIDER_SITE_OTHER): Payer: Medicare Other | Admitting: Internal Medicine

## 2014-04-06 ENCOUNTER — Other Ambulatory Visit (INDEPENDENT_AMBULATORY_CARE_PROVIDER_SITE_OTHER): Payer: Medicare Other

## 2014-04-06 VITALS — BP 114/66 | HR 72 | Temp 98.3°F | Wt 116.0 lb

## 2014-04-06 DIAGNOSIS — M810 Age-related osteoporosis without current pathological fracture: Secondary | ICD-10-CM | POA: Diagnosis not present

## 2014-04-06 DIAGNOSIS — H543 Unqualified visual loss, both eyes: Secondary | ICD-10-CM

## 2014-04-06 DIAGNOSIS — E785 Hyperlipidemia, unspecified: Secondary | ICD-10-CM

## 2014-04-06 DIAGNOSIS — I729 Aneurysm of unspecified site: Secondary | ICD-10-CM

## 2014-04-06 LAB — CBC WITH DIFFERENTIAL/PLATELET
BASOS PCT: 0.4 % (ref 0.0–3.0)
Basophils Absolute: 0 10*3/uL (ref 0.0–0.1)
EOS PCT: 2.5 % (ref 0.0–5.0)
Eosinophils Absolute: 0.1 10*3/uL (ref 0.0–0.7)
HEMATOCRIT: 39.3 % (ref 36.0–46.0)
HEMOGLOBIN: 13.3 g/dL (ref 12.0–15.0)
LYMPHS ABS: 1.8 10*3/uL (ref 0.7–4.0)
Lymphocytes Relative: 34.5 % (ref 12.0–46.0)
MCHC: 33.7 g/dL (ref 30.0–36.0)
MCV: 93.2 fl (ref 78.0–100.0)
MONOS PCT: 9.1 % (ref 3.0–12.0)
Monocytes Absolute: 0.5 10*3/uL (ref 0.1–1.0)
NEUTROS ABS: 2.8 10*3/uL (ref 1.4–7.7)
Neutrophils Relative %: 53.5 % (ref 43.0–77.0)
Platelets: 211 10*3/uL (ref 150.0–400.0)
RBC: 4.22 Mil/uL (ref 3.87–5.11)
RDW: 13.5 % (ref 11.5–15.5)
WBC: 5.2 10*3/uL (ref 4.0–10.5)

## 2014-04-06 LAB — HEPATIC FUNCTION PANEL
ALBUMIN: 4.2 g/dL (ref 3.5–5.2)
ALT: 16 U/L (ref 0–35)
AST: 23 U/L (ref 0–37)
Alkaline Phosphatase: 61 U/L (ref 39–117)
Bilirubin, Direct: 0.1 mg/dL (ref 0.0–0.3)
Total Bilirubin: 0.6 mg/dL (ref 0.2–1.2)
Total Protein: 7.2 g/dL (ref 6.0–8.3)

## 2014-04-06 LAB — TSH: TSH: 3.05 u[IU]/mL (ref 0.35–4.50)

## 2014-04-06 LAB — LIPID PANEL
CHOLESTEROL: 232 mg/dL — AB (ref 0–200)
HDL: 66.8 mg/dL (ref >39.00)
LDL CALC: 158 mg/dL — AB (ref 0–99)
NonHDL: 165.2
Total CHOL/HDL Ratio: 3
Triglycerides: 36 mg/dL (ref 0.0–149.0)
VLDL: 7.2 mg/dL (ref 0.0–40.0)

## 2014-04-06 LAB — BASIC METABOLIC PANEL
BUN: 15 mg/dL (ref 6–23)
CO2: 29 mEq/L (ref 19–32)
Calcium: 9.7 mg/dL (ref 8.4–10.5)
Chloride: 104 mEq/L (ref 96–112)
Creatinine, Ser: 0.7 mg/dL (ref 0.4–1.2)
GFR: 83.23 mL/min (ref >60.00)
GLUCOSE: 88 mg/dL (ref 70–99)
POTASSIUM: 4.5 meq/L (ref 3.5–5.1)
SODIUM: 140 meq/L (ref 135–145)

## 2014-04-06 LAB — SEDIMENTATION RATE: Sed Rate: 9 mm/hr (ref 0–22)

## 2014-04-06 NOTE — Patient Instructions (Signed)
Your next office appointment will be determined based upon review of your pending labs & imagings. Those instructions will be transmitted to you  by mail.

## 2014-04-06 NOTE — Progress Notes (Signed)
Pre visit review using our clinic review tool, if applicable. No additional management support is needed unless otherwise documented below in the visit note. 

## 2014-04-06 NOTE — Progress Notes (Signed)
Subjective:    Patient ID: Sheryl Huang, female    DOB: 1946-06-06, 68 y.o.   MRN: [id number]  HPI  The ophthalmologic records from Dr Katy Fitch were reviewed. She has had at least 2 episodes since October 2014 of loss of vision in the lower fields. She is uncertain whether this affected both eyes or not. The symptoms last less than 5 minutes.  She had no cardiac or neurologic prodrome prior to the events.  She's also recently had some headaches in the right mastoid or occipital area which are described as dull and lasting hours. She also describes these as a "wave". She's taking nonsteroidals without benefit.   Review of Systems  She denies double vision or blurred vision other than the loss inferiorly.  She's had no loss of sense of smell loss of sense of taste.  She has no tinnitus or hearing loss  She's had no dysphagia or hoarseness.   Denied were any change in heart rhythm or rate prior to the event. There was no associated chest pain or shortness of breath .  Specifically denied prior than were headache or limb weakness, tingling, or numbness. No seizure activity noted.   She has occasional heaviness in the right lower extremity with ambulation.        Objective:   Physical Exam Positive or pertinent physical findings include thin body habitus. She has a very large, irregular left cervical rib. There is unsustained vertical and lateral nystagmus. Tuning fork exam was normal. There is suggestion of an aortic aneurysm; it measures 5.5 cm. She states that this has been evaluated; no aneurysm was found.She states the pseudoaneurysm was due to scoliosis. She does have slight clubbing without associated cyanosis or edema.  Gen.:  well-nourished in appearance. Alert, appropriate and cooperative throughout exam. Appears younger than stated age  Head: Normocephalic without obvious abnormalities Eyes: No corneal or conjunctival inflammation noted. Pupils equal round  reactive to light and accommodation.  Ears: External  ear exam reveals no significant lesions or deformities. Canals clear .TMs normal. Hearing is grossly normal bilaterally.  Nose: External nasal exam reveals no deformity or inflammation. Nasal mucosa are pink and moist. No lesions or exudates noted.   Mouth: Oral mucosa and oropharynx reveal no lesions or exudates. Teeth in good repair. Neck: No deformities, masses, or tenderness noted. Range of motion & Thyroid normal. Lungs: Normal respiratory effort; chest expands symmetrically. Lungs are clear to auscultation without rales, wheezes, or increased work of breathing. Heart: Normal rate and rhythm. Normal S1 and S2. No gallop, click, or rub. No murmur. Abdomen: Bowel sounds normal; abdomen soft and nontender. No masses, organomegaly or hernias noted.                                 Musculoskeletal/extremities: No clubbing, cyanosis, edema, or significant extremity  deformity noted. Range of motion normal .Tone & strength normal. Hand joints normal.  Fingernail health good. Able to lie down & sit up w/o help. Negative SLR bilaterally Vascular: Carotid, radial artery, dorsalis pedis and  posterior tibial pulses are full and equal. No bruits present. Neurologic: Alert and oriented x3. Deep tendon reflexes symmetrical and normal.  Gait normal  including heel & toe walking . Rhomberg & finger to nose negative     Skin: Intact without suspicious lesions or rashes. Lymph: No cervical, axillary lymphadenopathy present. Psych: Mood and affect are normal. Normally interactive  Assessment & Plan:  #1 visual field loss #2 pseudoaneurysm #3 dyslipidemia See orders

## 2014-04-27 ENCOUNTER — Other Ambulatory Visit (HOSPITAL_COMMUNITY): Payer: Medicare Other

## 2014-05-08 ENCOUNTER — Ambulatory Visit (HOSPITAL_COMMUNITY): Payer: Medicare Other | Attending: Internal Medicine | Admitting: Radiology

## 2014-05-08 ENCOUNTER — Other Ambulatory Visit (HOSPITAL_COMMUNITY): Payer: Medicare Other

## 2014-05-08 ENCOUNTER — Other Ambulatory Visit: Payer: Self-pay

## 2014-05-08 DIAGNOSIS — E785 Hyperlipidemia, unspecified: Secondary | ICD-10-CM | POA: Diagnosis not present

## 2014-05-08 DIAGNOSIS — H543 Unqualified visual loss, both eyes: Secondary | ICD-10-CM | POA: Diagnosis not present

## 2014-05-08 DIAGNOSIS — M4120 Other idiopathic scoliosis, site unspecified: Secondary | ICD-10-CM | POA: Diagnosis not present

## 2014-05-08 DIAGNOSIS — I079 Rheumatic tricuspid valve disease, unspecified: Secondary | ICD-10-CM | POA: Diagnosis not present

## 2014-05-08 DIAGNOSIS — G459 Transient cerebral ischemic attack, unspecified: Secondary | ICD-10-CM | POA: Diagnosis not present

## 2014-05-08 NOTE — Progress Notes (Signed)
Echocardiogram performed.  

## 2014-05-15 ENCOUNTER — Other Ambulatory Visit: Payer: Self-pay | Admitting: Internal Medicine

## 2014-05-15 ENCOUNTER — Telehealth: Payer: Self-pay

## 2014-05-15 DIAGNOSIS — H543 Unqualified visual loss, both eyes: Secondary | ICD-10-CM

## 2014-05-15 NOTE — Telephone Encounter (Signed)
I tried but [facility] did not take VASO1 as order

## 2014-05-15 NOTE — Telephone Encounter (Signed)
Message copied by Shelly Coss on Fri May 15, 2014 11:53 AM ------      Message from: Dorian Pod      Created: Fri May 15, 2014 11:49 AM      Regarding: carotid duplex       Please enter order for carotid duplex (VAS01). It was previously entered as imaging, but needs to be a vascular order. Thank you! ------

## 2014-05-15 NOTE — Telephone Encounter (Signed)
Dr Linna Darner will you please help with this order. I can not find it. Thanks

## 2014-05-20 ENCOUNTER — Ambulatory Visit (HOSPITAL_COMMUNITY): Payer: Medicare Other | Attending: Internal Medicine | Admitting: Cardiology

## 2014-05-20 DIAGNOSIS — E785 Hyperlipidemia, unspecified: Secondary | ICD-10-CM | POA: Diagnosis not present

## 2014-05-20 DIAGNOSIS — H543 Unqualified visual loss, both eyes: Secondary | ICD-10-CM | POA: Diagnosis not present

## 2014-05-20 DIAGNOSIS — H539 Unspecified visual disturbance: Secondary | ICD-10-CM | POA: Diagnosis not present

## 2014-05-20 DIAGNOSIS — H53129 Transient visual loss, unspecified eye: Secondary | ICD-10-CM

## 2014-05-20 NOTE — Progress Notes (Signed)
Carotid duplex performed 

## 2014-05-21 ENCOUNTER — Other Ambulatory Visit: Payer: Self-pay | Admitting: Internal Medicine

## 2014-05-21 DIAGNOSIS — H543 Unqualified visual loss, both eyes: Secondary | ICD-10-CM

## 2014-06-17 ENCOUNTER — Ambulatory Visit (INDEPENDENT_AMBULATORY_CARE_PROVIDER_SITE_OTHER): Payer: Medicare Other | Admitting: Neurology

## 2014-06-17 ENCOUNTER — Encounter: Payer: Self-pay | Admitting: Neurology

## 2014-06-17 VITALS — BP 110/60 | HR 67 | Resp 16 | Ht 67.0 in | Wt 115.0 lb

## 2014-06-17 DIAGNOSIS — R209 Unspecified disturbances of skin sensation: Secondary | ICD-10-CM

## 2014-06-17 DIAGNOSIS — R200 Anesthesia of skin: Secondary | ICD-10-CM

## 2014-06-17 DIAGNOSIS — H539 Unspecified visual disturbance: Secondary | ICD-10-CM | POA: Diagnosis not present

## 2014-06-17 NOTE — Patient Instructions (Signed)
1. Schedule MRI brain without contrast 2. MRA head without contrast 3. Continue daily aspirin 4. Monitor blood pressure, particularly during visual episodes 5. If symptoms recur and progress, go to ER immediately

## 2014-06-17 NOTE — Progress Notes (Signed)
NEUROLOGY CONSULTATION NOTE  Sheryl Huang MRN: [id number] DOB: April 18, 1946  Referring provider: Dr. Unice Cobble Primary care provider: Dr. Unice Cobble  Reason for consult:  Episodes of vision loss  Dear Dr Linna Darner:  Thank you for your kind referral of Sheryl Huang for consultation of the above symptoms. Although her history is well known to you, please allow me to reiterate it for the purpose of our medical record. Records and images were personally reviewed where available.  HISTORY OF PRESENT ILLNESS: This is a very pleasant 68 year old right-handed woman with a history of hyperlipidemia, presenting for recurrent episodes of loss of vision in the inferior quadrants lasting 2-3 minutes. The first episode occurred last Fall, she was reading then noticed that she could not see the bottom part of the letters.  She fixed her glasses, put the book aside, then looked back and could see clearly 2-3 minutes later. Around 6-7 weeks later, she was watching TV and noticed she could not see the bottom part of the screen, again lasting for a few minutes.  She reports having 4 or 5 episodes in the past year, most recently was around February 2015. She had seen ophthalmologist Dr. Katy Huang, records unavailable for review, however per patient, eye exam is normal. She is unsure if both eyes are affected, she had not covered each eye during the events. She denies any associated headache, dizziness, incoordination, focal numbness/tingling/weakness with the events.    She has episodes of dizziness that occur independently, occurring around once a month for the past 4 years.  She would feel lightheaded for a day, no nausea, vomiting, diplopia, or focal symptoms.  For the past 3-4 years, she has noticed occasional right leg weakness, one time she stumbled when her right leg went out from under her.  She started aspirin 4-5 years ago after she reported recurrent episodes of right facial numbness and  tingling to her PCP. She reports this has occurred around 8 or 9 times, last episode was 6-7 months ago.  She has occasional headaches in the right occipital region lasting for several hours. As a child, she had "sick headaches" where she would have severe headaches with nausea in elementary school and could not function. After puberty, these headaches decreased, and she cannot recall the last time she had such a severe headache.    She has had an echocardiogram last month with EF 55-60%, normal left atrium, normal LV function. Carotid dopplers normal, no plaque formation seen.    Laboratory Data: Component     Latest Ref Rng 04/06/2014  WBC     4.0 - 10.5 K/uL 5.2  RBC     3.87 - 5.11 Mil/uL 4.22  Hemoglobin     12.0 - 15.0 g/dL 13.3  HCT     36.0 - 46.0 % 39.3  MCV     78.0 - 100.0 fl 93.2  MCHC     30.0 - 36.0 g/dL 33.7  RDW     11.5 - 15.5 % 13.5  Platelets     150.0 - 400.0 K/uL 211.0  Neutrophils Relative %     43.0 - 77.0 % 53.5  Lymphocytes Relative     12.0 - 46.0 % 34.5  Monocytes Relative     3.0 - 12.0 % 9.1  Eosinophils Relative     0.0 - 5.0 % 2.5  Basophils Relative     0.0 - 3.0 % 0.4  NEUT#     1.4 -  7.7 K/uL 2.8  Lymphocytes Absolute     0.7 - 4.0 K/uL 1.8  Monocytes Absolute     0.1 - 1.0 K/uL 0.5  Eosinophils Absolute     0.0 - 0.7 K/uL 0.1  Basophils Absolute     0.0 - 0.1 K/uL 0.0  Sodium     135 - 145 mEq/L 140  Potassium     3.5 - 5.1 mEq/L 4.5  Chloride     96 - 112 mEq/L 104  CO2     19 - 32 mEq/L 29  Glucose     70 - 99 mg/dL 88  BUN     6 - 23 mg/dL 15  Creatinine     0.4 - 1.2 mg/dL 0.7  Calcium     8.4 - 10.5 mg/dL 9.7  GFR     >60.00 mL/min 83.23  Total Bilirubin     0.2 - 1.2 mg/dL 0.6  Bilirubin, Direct     0.0 - 0.3 mg/dL 0.1  Alkaline Phosphatase     39 - 117 U/L 61  AST     0 - 37 U/L 23  ALT     0 - 35 U/L 16  Total Protein     6.0 - 8.3 g/dL 7.2  Albumin     3.5 - 5.2 g/dL 4.2  Cholesterol     0 - 200  mg/dL 232 (H)  Triglycerides     0.0 - 149.0 mg/dL 36.0  HDL     >39.00 mg/dL 66.80  VLDL     0.0 - 40.0 mg/dL 7.2  LDL (calc)     0 - 99 mg/dL 158 (H)  Total CHOL/HDL Ratio      3  NonHDL      165.20  TSH     0.35 - 4.50 uIU/mL 3.05  Sed Rate     0 - 22 mm/hr 9   PAST MEDICAL HISTORY: Past Medical History  Diagnosis Date  . Osteoporosis 05/14/2012    T score -3.2  . History of shingles     PAST SURGICAL HISTORY: Past Surgical History  Procedure Laterality Date  . Breast biopsy  2001    left  . Ovarian cyst removal  1995  . Tubal ligation  1992  . Appendectomy  1969  . Colonoscopy  2014    neg    MEDICATIONS: Current Outpatient Prescriptions on File Prior to Visit  Medication Sig Dispense Refill  . aspirin 325 MG tablet Take 325 mg by mouth daily.        . Calcium Carbonate-Vitamin D (CALCIUM 600 + D PO) Take 1 tablet by mouth 2 (two) times daily.      Marland Kitchen POTASSIUM PO Take 1 tablet by mouth daily.        No current facility-administered medications on file prior to visit.    ALLERGIES: Allergies  Allergen Reactions  . Erythromycin     REACTION: SEVERE GI UPSET    FAMILY HISTORY: Family History  Problem Relation Age of Onset  . Hypertension Mother   . Hypertension Father   . Colon cancer Brother   . Heart attack Father   . Diabetes Neg Hx   . Stroke Mother 75  . Heart attack      M uncle & MGF  . Coronary artery disease      P aunt & uncle  . Heart attack Paternal Grandmother     ? age    SOCIAL HISTORY: History  Social History  . Marital Status: Married    Spouse Name: N/A    Number of Children: N/A  . Years of Education: N/A   Occupational History  . Not on file.   Social History Main Topics  . Smoking status: Never Smoker   . Smokeless tobacco: Never Used  . Alcohol Use: No  . Drug Use: No  . Sexual Activity: Yes    Birth Control/ Protection: Post-menopausal, Surgical     Comment: BTL   Other Topics Concern  . Not on file    Social History Narrative  . No narrative on file    REVIEW OF SYSTEMS: Constitutional: No fevers, chills, or sweats, no generalized fatigue, change in appetite Eyes: No visual changes, double vision, eye pain Ear, nose and throat: No hearing loss, ear pain, nasal congestion, sore throat Cardiovascular: No chest pain, palpitations Respiratory:  No shortness of breath at rest or with exertion, wheezes GastrointestinaI: No nausea, vomiting, diarrhea, abdominal pain, fecal incontinence Genitourinary:  No dysuria, urinary retention or frequency Musculoskeletal:  No neck pain, back pain Integumentary: No rash, pruritus, skin lesions Neurological: as above Psychiatric: No depression, insomnia, anxiety Endocrine: No palpitations, fatigue, diaphoresis, mood swings, change in appetite, change in weight, increased thirst Hematologic/Lymphatic:  No anemia, purpura, petechiae. Allergic/Immunologic: no itchy/runny eyes, nasal congestion, recent allergic reactions, rashes  PHYSICAL EXAM: Filed Vitals:   06/17/14 1017  BP: 110/60  Pulse: 67  Resp: 16   General: No acute distress Head:  Normocephalic/atraumatic Eyes: Fundoscopic exam shows bilateral sharp discs, no vessel changes, exudates, or hemorrhages Neck: supple, no paraspinal tenderness, full range of motion Back: No paraspinal tenderness Heart: regular rate and rhythm Lungs: Clear to auscultation bilaterally. Vascular: No carotid bruits. Skin/Extremities: No rash, no edema Neurological Exam: Mental status: alert and oriented to person, place, and time, no dysarthria or aphasia, Fund of knowledge is appropriate.  Recent and remote memory are intact.  Attention and concentration are normal.    Able to name objects and repeat phrases. Cranial nerves: CN I: not tested CN II: pupils equal, round and reactive to light, visual fields intact, fundi unremarkable. CN III, IV, VI:  full range of motion, no nystagmus, no ptosis CN V: facial  sensation intact CN VII: upper and lower face symmetric CN VIII: hearing intact to finger rub CN IX, X: gag intact, uvula midline CN XI: sternocleidomastoid and trapezius muscles intact CN XII: tongue midline Bulk & Tone: normal, no fasciculations. Motor: 5/5 throughout with no pronator drift. Sensation: intact to light touch, cold, pin, vibration and joint position sense.  No extinction to double simultaneous stimulation.  Romberg test negative Deep Tendon Reflexes: +2 throughout, no ankle clonus Plantar responses: downgoing bilaterally Cerebellar: no incoordination on finger to nose, heel to shin. No dysdiadochokinesia Gait: narrow-based and steady, able to tandem walk adequately. Tremor: none  IMPRESSION: This is a pleasant 68 year old right-handed woman with a history of hyperlipidemia, presenting with at least 4 or 5 transient episodes of loss of vision in the inferior quadrants, possibly bilateral however she has not tested herself during the episodes.  Eye exam by ophthalmology normal per patient, records will be requested for review.  Her echo and carotid dopplers are normal. She also reports episodes of right facial numbness for the past 4-5 years. Concern is for transient ischemia in the posterior circulation, MRI brain and MRA head will be ordered. She will continue with aspirin, consideration for starting statin will be done, particularly if MRA shows  intracranial atherosclerosis. Other consideration is ocular migraines. She will check her BP if episodes recur, and knows to go to the ER immediately if symptoms recur and progress.  She will follow-up in 4 weeks.  Thank you for allowing me to participate in the care of this patient. Please do not hesitate to call for any questions or concerns.   Ellouise Newer, M.D.  CC: Dr. Linna Darner

## 2014-06-18 ENCOUNTER — Telehealth: Payer: Self-pay | Admitting: Family Medicine

## 2014-06-18 ENCOUNTER — Encounter: Payer: Self-pay | Admitting: Neurology

## 2014-06-18 NOTE — Telephone Encounter (Signed)
Called patient, no answer. Lmom to return my call. Was not able to get records from Dr. Zenia Resides office without medical record release. Need pt to either come by our office or go by dr. Zenia Resides office to sign record release to be submitted.

## 2014-06-18 NOTE — Telephone Encounter (Signed)
Message copied by Thurmon Fair on Thu Jun 18, 2014  4:22 PM ------      Message from: Cameron Sprang      Created: Thu Jun 18, 2014 12:52 PM      Regarding: pls request ophtho notes       Her ophtho is Dr. Katy Fitch, thanks! ------

## 2014-06-22 ENCOUNTER — Telehealth: Payer: Self-pay | Admitting: Neurology

## 2014-06-22 NOTE — Telephone Encounter (Signed)
Spoke with patient. Informed that Dr. Delice Lesch is requesting to get her records from Dr. Zenia Resides office and we need a signed release from her before Dr. Zenia Resides office will send those records. Patient states she will stop by the office tomorrow morning to sign and release so we can get those records.

## 2014-06-22 NOTE — Telephone Encounter (Signed)
Pt returning your call please call her at [phone]

## 2014-06-25 ENCOUNTER — Ambulatory Visit
Admission: RE | Admit: 2014-06-25 | Discharge: 2014-06-25 | Disposition: A | Discharge status: Home / Self Care | Payer: Medicare Other | Source: Ambulatory Visit | Attending: Neurology | Admitting: Neurology

## 2014-06-25 DIAGNOSIS — R200 Anesthesia of skin: Secondary | ICD-10-CM

## 2014-06-25 DIAGNOSIS — H539 Unspecified visual disturbance: Secondary | ICD-10-CM

## 2014-06-25 DIAGNOSIS — R209 Unspecified disturbances of skin sensation: Secondary | ICD-10-CM | POA: Diagnosis not present

## 2014-07-15 ENCOUNTER — Encounter: Payer: Self-pay | Admitting: Neurology

## 2014-07-15 ENCOUNTER — Other Ambulatory Visit: Payer: Self-pay | Admitting: Neurology

## 2014-07-15 ENCOUNTER — Ambulatory Visit (INDEPENDENT_AMBULATORY_CARE_PROVIDER_SITE_OTHER): Payer: Medicare Other | Admitting: Neurology

## 2014-07-15 VITALS — BP 100/58 | HR 74 | Resp 16 | Ht 67.0 in | Wt 115.0 lb

## 2014-07-15 DIAGNOSIS — H539 Unspecified visual disturbance: Secondary | ICD-10-CM | POA: Diagnosis not present

## 2014-07-15 DIAGNOSIS — R209 Unspecified disturbances of skin sensation: Secondary | ICD-10-CM

## 2014-07-15 DIAGNOSIS — R200 Anesthesia of skin: Secondary | ICD-10-CM | POA: Insufficient documentation

## 2014-07-15 LAB — VITAMIN B12: VITAMIN B 12: 1263 pg/mL — AB (ref 211–911)

## 2014-07-15 NOTE — Patient Instructions (Signed)
1. Start low cholesterol, low salt diet 2. We will re-check cholesterol levels in 3 months 3. Bloodwork for B12 level 4. Call our office for any changes in symptoms, follow-up in 3 months

## 2014-07-15 NOTE — Progress Notes (Signed)
NEUROLOGY FOLLOW UP OFFICE NOTE  Sheryl Huang [id number]  HISTORY OF PRESENT ILLNESS: I had the pleasure of seeing Sheryl Huang in follow-up in the neurology clinic on 07/15/2014.  The patient was last seen a month ago for recurrent episodes of vision loss in the inferior quadrants lasting 2-3 minutes.  No associated headache or focal symptoms.  Records and images were personally reviewed where available.  I personally reviewed MRI/MRA brain which were normal, no evidence of tumor, stroke, intracranial stenosis.  Echo and carotid dopplers normal.  She denies any further similar symptoms since February 2015.  She denies any recent dizziness, right-sided numbness or weakness.  She walks 4 days a week and has noticed that her balance is off, no falls.  HPI:  This is a very pleasant 68 yo RH woman with a history of hyperlipidemia who presented with recurrent episodes of loss of vision in the inferior quadrants lasting 2-3 minutes. The first episode occurred last Fall, she was reading then noticed that she could not see the bottom part of the letters. She fixed her glasses, put the book aside, then looked back and could see clearly 2-3 minutes later. Around 6-7 weeks later, she was watching TV and noticed she could not see the bottom part of the screen, again lasting for a few minutes. She reports having 4 or 5 episodes in the past year, most recently was around February 2015. She had seen ophthalmologist Dr. Katy Fitch, she has cataracts that are being monitored.She is unsure if both eyes are affected, she had not covered each eye during the events. She denies any associated headache, dizziness, incoordination, focal numbness/tingling/weakness with the events.   She has episodes of dizziness that occur independently, occurring around once a month for the past 4 years. She would feel lightheaded for a day, no nausea, vomiting, diplopia, or focal symptoms. For the past 3-4 years, she has noticed occasional  right leg weakness, one time she stumbled when her right leg went out from under her. She started aspirin 4-5 years ago after she reported recurrent episodes of right facial numbness and tingling to her PCP. She reports this has occurred around 8 or 9 times, last episode was 6-7 months ago. She has occasional headaches in the right occipital region lasting for several hours. As a child, she had "sick headaches" where she would have severe headaches with nausea in elementary school and could not function. After puberty, these headaches decreased, and she cannot recall the last time she had such a severe headache.   She has had an echocardiogram in July 2015 with EF 55-60%, normal left atrium, normal LV function. Carotid dopplers normal, no plaque formation seen.   PAST MEDICAL HISTORY: Past Medical History  Diagnosis Date  . Osteoporosis 05/14/2012    T score -3.2  . History of shingles     MEDICATIONS: Current Outpatient Prescriptions on File Prior to Visit  Medication Sig Dispense Refill  . aspirin 325 MG tablet Take 325 mg by mouth daily.        . Calcium Carbonate-Vitamin D (CALCIUM 600 + D PO) Take 1 tablet by mouth 2 (two) times daily.      Marland Kitchen POTASSIUM PO Take 1 tablet by mouth daily.        No current facility-administered medications on file prior to visit.    ALLERGIES: Allergies  Allergen Reactions  . Erythromycin     REACTION: SEVERE GI UPSET    FAMILY HISTORY: Family History  Problem  Relation Age of Onset  . Hypertension Mother   . Hypertension Father   . Colon cancer Brother   . Heart attack Father   . Diabetes Neg Hx   . Stroke Mother 12  . Heart attack      M uncle & MGF  . Coronary artery disease      P aunt & uncle  . Heart attack Paternal Grandmother     ? age    SOCIAL HISTORY: History   Social History  . Marital Status: Married    Spouse Name: N/A    Number of Children: N/A  . Years of Education: N/A   Occupational History  . Not on file.    Social History Main Topics  . Smoking status: Never Smoker   . Smokeless tobacco: Never Used  . Alcohol Use: No  . Drug Use: No  . Sexual Activity: Yes    Birth Control/ Protection: Post-menopausal, Surgical     Comment: BTL   Other Topics Concern  . Not on file   Social History Narrative  . No narrative on file    REVIEW OF SYSTEMS: Constitutional: No fevers, chills, or sweats, no generalized fatigue, change in appetite Eyes: No visual changes, double vision, eye pain Ear, nose and throat: No hearing loss, ear pain, nasal congestion, sore throat Cardiovascular: No chest pain, palpitations Respiratory:  No shortness of breath at rest or with exertion, wheezes GastrointestinaI: No nausea, vomiting, diarrhea, abdominal pain, fecal incontinence Genitourinary:  No dysuria, urinary retention or frequency Musculoskeletal:  No neck pain, back pain Integumentary: No rash, pruritus, skin lesions Neurological: as above Psychiatric: No depression, insomnia, anxiety Endocrine: No palpitations, fatigue, diaphoresis, mood swings, change in appetite, change in weight, increased thirst Hematologic/Lymphatic:  No anemia, purpura, petechiae. Allergic/Immunologic: no itchy/runny eyes, nasal congestion, recent allergic reactions, rashes  PHYSICAL EXAM: Filed Vitals:   07/15/14 1014  BP: 100/58  Pulse: 74  Resp: 16   General: No acute distress Head:  Normocephalic/atraumatic Neck: supple, no paraspinal tenderness, full range of motion Heart:  Regular rate and rhythm Lungs:  Clear to auscultation bilaterally Back: No paraspinal tenderness Skin/Extremities: No rash, no edema Neurological Exam: alert and oriented to person, place, and time. No aphasia or dysarthria. Fund of knowledge is appropriate.  Recent and remote memory are intact.  Attention and concentration are normal.    Able to name objects and repeat phrases. Cranial nerves: Pupils equal, round, reactive to light.  Fundoscopic  exam unremarkable, no papilledema. Extraocular movements intact with no nystagmus. Visual fields full. Facial sensation intact. No facial asymmetry. Tongue, uvula, palate midline.  Motor: Bulk and tone normal, muscle strength 5/5 throughout with no pronator drift.  Sensation to light touch, temperature and vibration intact.  No extinction to double simultaneous stimulation.  Deep tendon reflexes 2+ throughout, toes downgoing.  Finger to nose testing intact.  Gait narrow-based and steady, able to tandem walk adequately.  Romberg negative.  IMPRESSION: This is a pleasant 68 yo RH woman with a history of hyperlipidemia, presenting with at least 4 or 5 transient episodes of loss of vision in the inferior quadrants, possibly bilateral however she has not tested herself during the episodes. Eye exam by ophthalmology showed cataracts.  MRI/MRA brain, echo and carotid dopplers are normal.  The etiology of her symptoms is unclear, considerations include ocular migraines, less likely TIA with normal extra- and intracranial vasculature. We did discuss keeping LDL less than 100, she would like to do diet and lifestyle modification  first and re-assess lipid panel in 3 months. Continue daily aspirin.  She will check her BP if episodes recur, and knows to go to the ER immediately if symptoms recur and progress. She reports some balance issues today, exam normal, check B12 level. She will do home balance exercises and may benefit from PT balance therapy in the future. She will follow-up in 3 months.  Thank you for allowing me to participate in her care.  Please do not hesitate to call for any questions or concerns.  The duration of this appointment visit was 15 minutes of face-to-face time with the patient.  Greater than 50% of this time was spent in counseling, explanation of diagnosis, planning of further management, and coordination of care.   Ellouise Newer, M.D.   CC: Dr. Linna Darner

## 2014-07-27 DIAGNOSIS — Z23 Encounter for immunization: Secondary | ICD-10-CM | POA: Diagnosis not present

## 2014-08-31 ENCOUNTER — Encounter: Payer: Self-pay | Admitting: Neurology

## 2014-10-06 ENCOUNTER — Telehealth: Payer: Self-pay | Admitting: Family Medicine

## 2014-10-06 DIAGNOSIS — E785 Hyperlipidemia, unspecified: Secondary | ICD-10-CM

## 2014-10-06 NOTE — Telephone Encounter (Signed)
-----   Message from Thurmon Fair, [place] sent at 07/15/2014 11:24 AM EDT ----- Regarding: Future Lab Patient needs fasting lipid panel. Before 12/16 f/u appt.

## 2014-10-06 NOTE — Telephone Encounter (Signed)
Called patient to remind her that she needed to have a fasting lipid panel before her f/u visit with Dr. Delice Lesch on 12/16. Advised patient needs to be fasting for this test, nothing to eat or drink after midnight the night before testing. Order created and left up front for p/u. Patient will come p/u order at the front desk and take it downstairs to [place].

## 2014-10-12 ENCOUNTER — Telehealth: Payer: Self-pay | Admitting: Neurology

## 2014-10-12 NOTE — Telephone Encounter (Signed)
Pt canceled her f/u appt for 10/14/14. Pt did not give a reason. Pt will call later to r/s appt.

## 2014-10-14 ENCOUNTER — Ambulatory Visit: Payer: Medicare Other | Admitting: Neurology

## 2014-10-19 ENCOUNTER — Encounter: Payer: No Typology Code available for payment source | Admitting: Gynecology

## 2014-10-21 ENCOUNTER — Ambulatory Visit (INDEPENDENT_AMBULATORY_CARE_PROVIDER_SITE_OTHER): Payer: Medicare Other | Admitting: Gynecology

## 2014-10-21 ENCOUNTER — Encounter: Payer: Self-pay | Admitting: Gynecology

## 2014-10-21 VITALS — BP 120/70 | Ht 66.0 in | Wt 117.0 lb

## 2014-10-21 DIAGNOSIS — M810 Age-related osteoporosis without current pathological fracture: Secondary | ICD-10-CM | POA: Diagnosis not present

## 2014-10-21 DIAGNOSIS — N952 Postmenopausal atrophic vaginitis: Secondary | ICD-10-CM | POA: Diagnosis not present

## 2014-10-21 NOTE — Patient Instructions (Signed)
Dry over the counter vaginal moisturizers like Replens to see if it does not help with the vaginal dryness. If it continues to be an issue then we can rediscuss possible estrogen use.  Follow up for the bone density as scheduled.  You may obtain a copy of any labs that were done today by logging onto MyChart as outlined in the instructions provided with your AVS (after visit summary). The office will not call with normal lab results but certainly if there are any significant abnormalities then we will contact you.   Health Maintenance, Female A healthy lifestyle and preventative care can promote health and wellness.  Maintain regular health, dental, and eye exams.  Eat a healthy diet. Foods like vegetables, fruits, whole grains, low-fat dairy products, and lean protein foods contain the nutrients you need without too many calories. Decrease your intake of foods high in solid fats, added sugars, and salt. Get information about a proper diet from your caregiver, if necessary.  Regular physical exercise is one of the most important things you can do for your health. Most adults should get at least 150 minutes of moderate-intensity exercise (any activity that increases your heart rate and causes you to sweat) each week. In addition, most adults need muscle-strengthening exercises on 2 or more days a week.   Maintain a healthy weight. The body mass index (BMI) is a screening tool to identify possible weight problems. It provides an estimate of body fat based on height and weight. Your caregiver can help determine your BMI, and can help you achieve or maintain a healthy weight. For adults 20 years and older:  A BMI below 18.5 is considered underweight.  A BMI of 18.5 to 24.9 is normal.  A BMI of 25 to 29.9 is considered overweight.  A BMI of 30 and above is considered obese.  Maintain normal blood lipids and cholesterol by exercising and minimizing your intake of saturated fat. Eat a balanced  diet with plenty of fruits and vegetables. Blood tests for lipids and cholesterol should begin at age 31 and be repeated every 5 years. If your lipid or cholesterol levels are high, you are over 50, or you are a high risk for heart disease, you may need your cholesterol levels checked more frequently.Ongoing high lipid and cholesterol levels should be treated with medicines if diet and exercise are not effective.  If you smoke, find out from your caregiver how to quit. If you do not use tobacco, do not start.  Lung cancer screening is recommended for adults aged 22 80 years who are at high risk for developing lung cancer because of a history of smoking. Yearly low-dose computed tomography (CT) is recommended for people who have at least a 30-pack-year history of smoking and are a current smoker or have quit within the past 15 years. A pack year of smoking is smoking an average of 1 pack of cigarettes a day for 1 year (for example: 1 pack a day for 30 years or 2 packs a day for 15 years). Yearly screening should continue until the smoker has stopped smoking for at least 15 years. Yearly screening should also be stopped for people who develop a health problem that would prevent them from having lung cancer treatment.  If you are pregnant, do not drink alcohol. If you are breastfeeding, be very cautious about drinking alcohol. If you are not pregnant and choose to drink alcohol, do not exceed 1 drink per day. One drink is considered to  be 12 ounces (355 mL) of beer, 5 ounces (148 mL) of wine, or 1.5 ounces (44 mL) of liquor.  Avoid use of street drugs. Do not share needles with anyone. Ask for help if you need support or instructions about stopping the use of drugs.  High blood pressure causes heart disease and increases the risk of stroke. Blood pressure should be checked at least every 1 to 2 years. Ongoing high blood pressure should be treated with medicines, if weight loss and exercise are not  effective.  If you are 5 to 67 years old, ask your caregiver if you should take aspirin to prevent strokes.  Diabetes screening involves taking a blood sample to check your fasting blood sugar level. This should be done once every 3 years, after age 74, if you are within normal weight and without risk factors for diabetes. Testing should be considered at a younger age or be carried out more frequently if you are overweight and have at least 1 risk factor for diabetes.  Breast cancer screening is essential preventative care for women. You should practice "breast self-awareness." This means understanding the normal appearance and feel of your breasts and may include breast self-examination. Any changes detected, no matter how small, should be reported to a caregiver. Women in their 21s and 30s should have a clinical breast exam (CBE) by a caregiver as part of a regular health exam every 1 to 3 years. After age 30, women should have a CBE every year. Starting at age 96, women should consider having a mammogram (breast X-ray) every year. Women who have a family history of breast cancer should talk to their caregiver about genetic screening. Women at a high risk of breast cancer should talk to their caregiver about having an MRI and a mammogram every year.  Breast cancer gene (BRCA)-related cancer risk assessment is recommended for women who have family members with BRCA-related cancers. BRCA-related cancers include breast, ovarian, tubal, and peritoneal cancers. Having family members with these cancers may be associated with an increased risk for harmful changes (mutations) in the breast cancer genes BRCA1 and BRCA2. Results of the assessment will determine the need for genetic counseling and BRCA1 and BRCA2 testing.  The Pap test is a screening test for cervical cancer. Women should have a Pap test starting at age 98. Between ages 13 and 74, Pap tests should be repeated every 2 years. Beginning at age 6,  you should have a Pap test every 3 years as long as the past 3 Pap tests have been normal. If you had a hysterectomy for a problem that was not cancer or a condition that could lead to cancer, then you no longer need Pap tests. If you are between ages 31 and 71, and you have had normal Pap tests going back 10 years, you no longer need Pap tests. If you have had past treatment for cervical cancer or a condition that could lead to cancer, you need Pap tests and screening for cancer for at least 20 years after your treatment. If Pap tests have been discontinued, risk factors (such as a new sexual partner) need to be reassessed to determine if screening should be resumed. Some women have medical problems that increase the chance of getting cervical cancer. In these cases, your caregiver may recommend more frequent screening and Pap tests.  The human papillomavirus (HPV) test is an additional test that may be used for cervical cancer screening. The HPV test looks for the virus that  can cause the cell changes on the cervix. The cells collected during the Pap test can be tested for HPV. The HPV test could be used to screen women aged 52 years and older, and should be used in women of any age who have unclear Pap test results. After the age of 61, women should have HPV testing at the same frequency as a Pap test.  Colorectal cancer can be detected and often prevented. Most routine colorectal cancer screening begins at the age of 24 and continues through age 64. However, your caregiver may recommend screening at an earlier age if you have risk factors for colon cancer. On a yearly basis, your caregiver may provide home test kits to check for hidden blood in the stool. Use of a small camera at the end of a tube, to directly examine the colon (sigmoidoscopy or colonoscopy), can detect the earliest forms of colorectal cancer. Talk to your caregiver about this at age 75, when routine screening begins. Direct examination of  the colon should be repeated every 5 to 10 years through age 49, unless early forms of pre-cancerous polyps or small growths are found.  Hepatitis C blood testing is recommended for all people born from [birth year] through 1964 and any individual with known risks for hepatitis C.  Practice safe sex. Use condoms and avoid high-risk sexual practices to reduce the spread of sexually transmitted infections (STIs). Sexually active women aged 34 and younger should be checked for Chlamydia, which is a common sexually transmitted infection. Older women with new or multiple partners should also be tested for Chlamydia. Testing for other STIs is recommended if you are sexually active and at increased risk.  Osteoporosis is a disease in which the bones lose minerals and strength with aging. This can result in serious bone fractures. The risk of osteoporosis can be identified using a bone density scan. Women ages 13 and over and women at risk for fractures or osteoporosis should discuss screening with their caregivers. Ask your caregiver whether you should be taking a calcium supplement or vitamin D to reduce the rate of osteoporosis.  Menopause can be associated with physical symptoms and risks. Hormone replacement therapy is available to decrease symptoms and risks. You should talk to your caregiver about whether hormone replacement therapy is right for you.  Use sunscreen. Apply sunscreen liberally and repeatedly throughout the day. You should seek shade when your shadow is shorter than you. Protect yourself by wearing long sleeves, pants, a wide-brimmed hat, and sunglasses year round, whenever you are outdoors.  Notify your caregiver of new moles or changes in moles, especially if there is a change in shape or color. Also notify your caregiver if a mole is larger than the size of a pencil eraser.  Stay current with your immunizations. Document Released: 05/01/2011 Document Revised: 02/10/2013 Document Reviewed:  05/01/2011 [facility] Patient Information 2014 [place].

## 2014-10-21 NOTE — Progress Notes (Signed)
[place]leton 1946-01-12 [id number]        68 y.o.  G1P1001 for follow up exam. Several issues noted below.  Past medical history,surgical history, problem list, medications, allergies, family history and social history were all reviewed and documented as reviewed in the EPIC chart.  ROS:  Performed with pertinent positives and negatives included in the history, assessment and plan.   Additional significant findings :  Vaginal dryness as discussed below   Exam: none assistant Filed Vitals:   10/21/14 0829  BP: 120/70  Height: 5\' 6"  (1.676 m)  Weight: 117 lb (53.071 kg)   General appearance:  Normal affect, orientation and appearance. Skin: Grossly normal HEENT: Without gross lesions.  No cervical or supraclavicular adenopathy. Thyroid normal.  Lungs:  Clear without wheezing, rales or rhonchi Cardiac: RR, without RMG Abdominal:  Soft, nontender, without masses, guarding, rebound, organomegaly or hernia Breasts:  Examined lying and sitting without masses, retractions, discharge or axillary adenopathy. Pelvic:  Ext/BUS/vagina with generalized atrophic changes  Cervix atrophic, normal in appearance  Uterus axial to anteverted, normal size, shape and contour, midline and mobile nontender   Adnexa  Without masses or tenderness    Anus and perineum  Normal   Rectovaginal  Normal sphincter tone without palpated masses or tenderness.    Assessment/Plan:  68 y.o. G59P1001 female for follow up exam.   1. Postmenopausal/atrophic genital changes. Not having significant hot flashes, night sweats or any vaginal bleeding. Is having issues with daily vaginal dryness. Options to include OTC moisturizers, vaginal estrogen products and Osphena now reviewed. Risks of absorption to include stroke heart attack DVT endometrial stimulation breast cancer reviewed. At this point patient wants trial of OTC products. Will call if continues to be an issue wants to rediscuss prescription  medications. 2. Osteoporosis.  DEXA 04/2012 T score -3.2.  History of Fosamax/Actonel for 8 years discontinued 2010 with stable DEXA afterwards. Patient prefers no active treatment but to monitor. Repeat DEXA now at 2 year interval. Check vitamin D level today. 3. Mammography 01/2014. Continue with annual mammography. SBE monthly reviewed. 4. Pap smear 2014. No Pap smear done today. No history of significant abnormal Pap smears. Options to stop screening altogether she is over the age of 68 versus less frequent screening intervals reviewed. Will readdress on an annual basis. 5. Colonoscopy 2014. Repeat at their recommended interval. 6. Health maintenance. No routine blood work done as patient has this done at her primary physician's office. Follow up for bone density otherwise 1 year, sooner as needed.     Anastasio Auerbach MD, 8:53 AM 10/21/2014

## 2014-10-22 LAB — URINALYSIS W MICROSCOPIC + REFLEX CULTURE
Bacteria, UA: NONE SEEN
Bilirubin Urine: NEGATIVE
CASTS: NONE SEEN
Crystals: NONE SEEN
Glucose, UA: NEGATIVE mg/dL
HGB URINE DIPSTICK: NEGATIVE
Ketones, ur: NEGATIVE mg/dL
Leukocytes, UA: NEGATIVE
NITRITE: NEGATIVE
Protein, ur: NEGATIVE mg/dL
Specific Gravity, Urine: 1.008 (ref 1.005–1.030)
Squamous Epithelial / LPF: NONE SEEN
Urobilinogen, UA: 0.2 mg/dL (ref 0.0–1.0)
pH: 6 (ref 5.0–8.0)

## 2014-10-22 LAB — VITAMIN D 25 HYDROXY (VIT D DEFICIENCY, FRACTURES): VIT D 25 HYDROXY: 38 ng/mL (ref 30–100)

## 2014-11-12 ENCOUNTER — Ambulatory Visit (INDEPENDENT_AMBULATORY_CARE_PROVIDER_SITE_OTHER): Payer: Medicare Other

## 2014-11-12 ENCOUNTER — Encounter: Payer: Self-pay | Admitting: Gynecology

## 2014-11-12 DIAGNOSIS — M810 Age-related osteoporosis without current pathological fracture: Secondary | ICD-10-CM

## 2015-02-12 DIAGNOSIS — L570 Actinic keratosis: Secondary | ICD-10-CM | POA: Diagnosis not present

## 2015-02-12 DIAGNOSIS — L820 Inflamed seborrheic keratosis: Secondary | ICD-10-CM | POA: Diagnosis not present

## 2015-02-12 DIAGNOSIS — X32XXXA Exposure to sunlight, initial encounter: Secondary | ICD-10-CM | POA: Diagnosis not present

## 2015-02-12 DIAGNOSIS — D225 Melanocytic nevi of trunk: Secondary | ICD-10-CM | POA: Diagnosis not present

## 2015-02-12 DIAGNOSIS — D485 Neoplasm of uncertain behavior of skin: Secondary | ICD-10-CM | POA: Diagnosis not present

## 2015-02-16 DIAGNOSIS — Z1231 Encounter for screening mammogram for malignant neoplasm of breast: Secondary | ICD-10-CM | POA: Diagnosis not present

## 2015-02-17 ENCOUNTER — Encounter: Payer: Self-pay | Admitting: Gynecology

## 2015-02-23 DIAGNOSIS — D485 Neoplasm of uncertain behavior of skin: Secondary | ICD-10-CM | POA: Diagnosis not present

## 2015-02-23 DIAGNOSIS — L98429 Non-pressure chronic ulcer of back with unspecified severity: Secondary | ICD-10-CM | POA: Diagnosis not present

## 2015-04-02 ENCOUNTER — Encounter: Payer: Self-pay | Admitting: Internal Medicine

## 2015-04-14 DIAGNOSIS — H40013 Open angle with borderline findings, low risk, bilateral: Secondary | ICD-10-CM | POA: Diagnosis not present

## 2015-04-14 DIAGNOSIS — H04123 Dry eye syndrome of bilateral lacrimal glands: Secondary | ICD-10-CM | POA: Diagnosis not present

## 2015-04-14 DIAGNOSIS — H02831 Dermatochalasis of right upper eyelid: Secondary | ICD-10-CM | POA: Diagnosis not present

## 2015-04-14 DIAGNOSIS — H02834 Dermatochalasis of left upper eyelid: Secondary | ICD-10-CM | POA: Diagnosis not present

## 2015-04-14 DIAGNOSIS — H10413 Chronic giant papillary conjunctivitis, bilateral: Secondary | ICD-10-CM | POA: Diagnosis not present

## 2015-04-14 DIAGNOSIS — H2513 Age-related nuclear cataract, bilateral: Secondary | ICD-10-CM | POA: Diagnosis not present

## 2015-07-30 DIAGNOSIS — Z23 Encounter for immunization: Secondary | ICD-10-CM | POA: Diagnosis not present

## 2015-10-07 ENCOUNTER — Encounter: Payer: Self-pay | Admitting: Internal Medicine

## 2015-10-07 ENCOUNTER — Ambulatory Visit (INDEPENDENT_AMBULATORY_CARE_PROVIDER_SITE_OTHER): Payer: Medicare Other | Admitting: Internal Medicine

## 2015-10-07 ENCOUNTER — Other Ambulatory Visit (INDEPENDENT_AMBULATORY_CARE_PROVIDER_SITE_OTHER): Payer: Medicare Other

## 2015-10-07 VITALS — BP 116/62 | HR 70 | Temp 98.5°F | Resp 16 | Ht 67.0 in | Wt 114.0 lb

## 2015-10-07 DIAGNOSIS — Z23 Encounter for immunization: Secondary | ICD-10-CM | POA: Diagnosis not present

## 2015-10-07 DIAGNOSIS — Z0000 Encounter for general adult medical examination without abnormal findings: Secondary | ICD-10-CM | POA: Diagnosis not present

## 2015-10-07 DIAGNOSIS — Z418 Encounter for other procedures for purposes other than remedying health state: Secondary | ICD-10-CM | POA: Diagnosis not present

## 2015-10-07 DIAGNOSIS — E789 Disorder of lipoprotein metabolism, unspecified: Secondary | ICD-10-CM | POA: Diagnosis not present

## 2015-10-07 DIAGNOSIS — Z299 Encounter for prophylactic measures, unspecified: Secondary | ICD-10-CM

## 2015-10-07 LAB — COMPREHENSIVE METABOLIC PANEL
ALK PHOS: 69 U/L (ref 39–117)
ALT: 13 U/L (ref 0–35)
AST: 22 U/L (ref 0–37)
Albumin: 4.4 g/dL (ref 3.5–5.2)
BUN: 17 mg/dL (ref 6–23)
CALCIUM: 10.2 mg/dL (ref 8.4–10.5)
CHLORIDE: 101 meq/L (ref 96–112)
CO2: 31 mEq/L (ref 19–32)
Creatinine, Ser: 0.79 mg/dL (ref 0.40–1.20)
GFR: 76.83 mL/min (ref >60.00)
GLUCOSE: 97 mg/dL (ref 70–99)
Potassium: 4.5 mEq/L (ref 3.5–5.1)
Sodium: 138 mEq/L (ref 135–145)
Total Bilirubin: 0.6 mg/dL (ref 0.2–1.2)
Total Protein: 7.4 g/dL (ref 6.0–8.3)

## 2015-10-07 LAB — LIPID PANEL
CHOLESTEROL: 250 mg/dL — AB (ref 0–200)
HDL: 63.8 mg/dL (ref >39.00)
LDL CALC: 171 mg/dL — AB (ref 0–99)
NonHDL: 185.93
Total CHOL/HDL Ratio: 4
Triglycerides: 76 mg/dL (ref 0.0–149.0)
VLDL: 15.2 mg/dL (ref 0.0–40.0)

## 2015-10-07 NOTE — Patient Instructions (Signed)
We have checked the blood work today and will call you back about the results.   We have given you the pneumonia booster and the tetanus shot today.   Come back next year for a check up and please feel free to call us sooner with any problems or questions.   Health Maintenance, Female Adopting a healthy lifestyle and getting preventive care can go a long way to promote health and wellness. Talk with your health care provider about what schedule of regular examinations is right for you. This is a good chance for you to check in with your provider about disease prevention and staying healthy. In between checkups, there are plenty of things you can do on your own. Experts have done a lot of research about which lifestyle changes and preventive measures are most likely to keep you healthy. Ask your health care provider for more information. WEIGHT AND DIET  Eat a healthy diet  Be sure to include plenty of vegetables, fruits, low-fat dairy products, and lean protein.  Do not eat a lot of foods high in solid fats, added sugars, or salt.  Get regular exercise. This is one of the most important things you can do for your health.  Most adults should exercise for at least 150 minutes each week. The exercise should increase your heart rate and make you sweat (moderate-intensity exercise).  Most adults should also do strengthening exercises at least twice a week. This is in addition to the moderate-intensity exercise.  Maintain a healthy weight  Body mass index (BMI) is a measurement that can be used to identify possible weight problems. It estimates body fat based on height and weight. Your health care provider can help determine your BMI and help you achieve or maintain a healthy weight.  For females 50 years of age and older:   A BMI below 18.5 is considered underweight.  A BMI of 18.5 to 24.9 is normal.  A BMI of 25 to 29.9 is considered overweight.  A BMI of 30 and above is considered  obese.  Watch levels of cholesterol and blood lipids  You should start having your blood tested for lipids and cholesterol at 68 years of age, then have this test every 5 years.  You may need to have your cholesterol levels checked more often if:  Your lipid or cholesterol levels are high.  You are older than 68 years of age.  You are at high risk for heart disease.  CANCER SCREENING   Lung Cancer  Lung cancer screening is recommended for adults 83-21 years old who are at high risk for lung cancer because of a history of smoking.  A yearly low-dose CT scan of the lungs is recommended for people who:  Currently smoke.  Have quit within the past 15 years.  Have at least a 30-pack-year history of smoking. A pack year is smoking an average of one pack of cigarettes a day for 1 year.  Yearly screening should continue until it has been 15 years since you quit.  Yearly screening should stop if you develop a health problem that would prevent you from having lung cancer treatment.  Breast Cancer  Practice breast self-awareness. This means understanding how your breasts normally appear and feel.  It also means doing regular breast self-exams. Let your health care provider know about any changes, no matter how small.  If you are in your 20s or 30s, you should have a clinical breast exam (CBE) by a health care  provider every 1-3 years as part of a regular health exam.  If you are 40 or older, have a CBE every year. Also consider having a breast X-ray (mammogram) every year.  If you have a family history of breast cancer, talk to your health care provider about genetic screening.  If you are at high risk for breast cancer, talk to your health care provider about having an MRI and a mammogram every year.  Breast cancer gene (BRCA) assessment is recommended for women who have family members with BRCA-related cancers. BRCA-related cancers  include:  Breast.  Ovarian.  Tubal.  Peritoneal cancers.  Results of the assessment will determine the need for genetic counseling and BRCA1 and BRCA2 testing. Cervical Cancer Your health care provider may recommend that you be screened regularly for cancer of the pelvic organs (ovaries, uterus, and vagina). This screening involves a pelvic examination, including checking for microscopic changes to the surface of your cervix (Pap test). You may be encouraged to have this screening done every 3 years, beginning at age 21.  For women ages 30-65, health care providers may recommend pelvic exams and Pap testing every 3 years, or they may recommend the Pap and pelvic exam, combined with testing for human papilloma virus (HPV), every 5 years. Some types of HPV increase your risk of cervical cancer. Testing for HPV may also be done on women of any age with unclear Pap test results.  Other health care providers may not recommend any screening for nonpregnant women who are considered low risk for pelvic cancer and who do not have symptoms. Ask your health care provider if a screening pelvic exam is right for you.  If you have had past treatment for cervical cancer or a condition that could lead to cancer, you need Pap tests and screening for cancer for at least 20 years after your treatment. If Pap tests have been discontinued, your risk factors (such as having a new sexual partner) need to be reassessed to determine if screening should resume. Some women have medical problems that increase the chance of getting cervical cancer. In these cases, your health care provider may recommend more frequent screening and Pap tests. Colorectal Cancer  This type of cancer can be detected and often prevented.  Routine colorectal cancer screening usually begins at 68 years of age and continues through 68 years of age.  Your health care provider may recommend screening at an earlier age if you have risk factors for  colon cancer.  Your health care provider may also recommend using home test kits to check for hidden blood in the stool.  A small camera at the end of a tube can be used to examine your colon directly (sigmoidoscopy or colonoscopy). This is done to check for the earliest forms of colorectal cancer.  Routine screening usually begins at age 50.  Direct examination of the colon should be repeated every 5-10 years through 68 years of age. However, you may need to be screened more often if early forms of precancerous polyps or small growths are found. Skin Cancer  Check your skin from head to toe regularly.  Tell your health care provider about any new moles or changes in moles, especially if there is a change in a mole's shape or color.  Also tell your health care provider if you have a mole that is larger than the size of a pencil eraser.  Always use sunscreen. Apply sunscreen liberally and repeatedly throughout the day.  Protect yourself   by wearing long sleeves, pants, a wide-brimmed hat, and sunglasses whenever you are outside. HEART DISEASE, DIABETES, AND HIGH BLOOD PRESSURE   High blood pressure causes heart disease and increases the risk of stroke. High blood pressure is more likely to develop in:  People who have blood pressure in the high end of the normal range (130-139/85-89 mm Hg).  People who are overweight or obese.  People who are African American.  If you are 69-2 years of age, have your blood pressure checked every 3-5 years. If you are 13 years of age or older, have your blood pressure checked every year. You should have your blood pressure measured twice--once when you are at a hospital or clinic, and once when you are not at a hospital or clinic. Record the average of the two measurements. To check your blood pressure when you are not at a hospital or clinic, you can use:  An automated blood pressure machine at a pharmacy.  A home blood pressure monitor.  If you  are between 66 years and 34 years old, ask your health care provider if you should take aspirin to prevent strokes.  Have regular diabetes screenings. This involves taking a blood sample to check your fasting blood sugar level.  If you are at a normal weight and have a low risk for diabetes, have this test once every three years after 68 years of age.  If you are overweight and have a high risk for diabetes, consider being tested at a younger age or more often. PREVENTING INFECTION  Hepatitis B  If you have a higher risk for hepatitis B, you should be screened for this virus. You are considered at high risk for hepatitis B if:  You were born in a country where hepatitis B is common. Ask your health care provider which countries are considered high risk.  Your parents were born in a high-risk country, and you have not been immunized against hepatitis B (hepatitis B vaccine).  You have HIV or AIDS.  You use needles to inject street drugs.  You live with someone who has hepatitis B.  You have had sex with someone who has hepatitis B.  You get hemodialysis treatment.  You take certain medicines for conditions, including cancer, organ transplantation, and autoimmune conditions. Hepatitis C  Blood testing is recommended for:  Everyone born from [birth year] through 1964.  Anyone with known risk factors for hepatitis C. Sexually transmitted infections (STIs)  You should be screened for sexually transmitted infections (STIs) including gonorrhea and chlamydia if:  You are sexually active and are younger than 68 years of age.  You are older than 68 years of age and your health care provider tells you that you are at risk for this type of infection.  Your sexual activity has changed since you were last screened and you are at an increased risk for chlamydia or gonorrhea. Ask your health care provider if you are at risk.  If you do not have HIV, but are at risk, it may be recommended that you  take a prescription medicine daily to prevent HIV infection. This is called pre-exposure prophylaxis (PrEP). You are considered at risk if:  You are sexually active and do not regularly use condoms or know the HIV status of your partner(s).  You take drugs by injection.  You are sexually active with a partner who has HIV. Talk with your health care provider about whether you are at high risk of being infected with HIV.  If you choose to begin PrEP, you should first be tested for HIV. You should then be tested every 3 months for as long as you are taking PrEP.  PREGNANCY   If you are premenopausal and you may become pregnant, ask your health care provider about preconception counseling.  If you may become pregnant, take 400 to 800 micrograms (mcg) of folic acid every day.  If you want to prevent pregnancy, talk to your health care provider about birth control (contraception). OSTEOPOROSIS AND MENOPAUSE   Osteoporosis is a disease in which the bones lose minerals and strength with aging. This can result in serious bone fractures. Your risk for osteoporosis can be identified using a bone density scan.  If you are 28 years of age or older, or if you are at risk for osteoporosis and fractures, ask your health care provider if you should be screened.  Ask your health care provider whether you should take a calcium or vitamin D supplement to lower your risk for osteoporosis.  Menopause may have certain physical symptoms and risks.  Hormone replacement therapy may reduce some of these symptoms and risks. Talk to your health care provider about whether hormone replacement therapy is right for you.  HOME CARE INSTRUCTIONS   Schedule regular health, dental, and eye exams.  Stay current with your immunizations.   Do not use any tobacco products including cigarettes, chewing tobacco, or electronic cigarettes.  If you are pregnant, do not drink alcohol.  If you are breastfeeding, limit how  much and how often you drink alcohol.  Limit alcohol intake to no more than 1 drink per day for nonpregnant women. One drink equals 12 ounces of beer, 5 ounces of wine, or 1 ounces of hard liquor.  Do not use street drugs.  Do not share needles.  Ask your health care provider for help if you need support or information about quitting drugs.  Tell your health care provider if you often feel depressed.  Tell your health care provider if you have ever been abused or do not feel safe at home.   This information is not intended to replace advice given to you by your health care provider. Make sure you discuss any questions you have with your health care provider.   Document Released: 05/01/2011 Document Revised: 11/06/2014 Document Reviewed: 09/17/2013 Elsevier Interactive Patient Education [employer].

## 2015-10-08 ENCOUNTER — Telehealth: Payer: Self-pay | Admitting: Internal Medicine

## 2015-10-08 LAB — HEPATITIS C ANTIBODY: HCV Ab: NEGATIVE

## 2015-10-08 NOTE — Telephone Encounter (Signed)
Pt returned your call. I informed pt of lab results. No need to call back

## 2015-10-09 ENCOUNTER — Encounter: Payer: Self-pay | Admitting: Internal Medicine

## 2015-10-09 DIAGNOSIS — Z0000 Encounter for general adult medical examination without abnormal findings: Secondary | ICD-10-CM | POA: Insufficient documentation

## 2015-10-09 NOTE — Assessment & Plan Note (Signed)
Checking labs, counseled her on sun protection and mole surveillance. Given prevnar 13 and tdap to update immunizations (needs pneumonia 23 next year). Colonscopy and mammogram up to date. Given 10 year screening recommendations at visit.

## 2015-10-09 NOTE — Progress Notes (Signed)
   Subjective:    Patient ID: Sheryl Huang, female    DOB: 08/29/1946, 69 y.o.   MRN: [id number]  HPI Here for medicare wellness, no new complaints. Please see A/P for status and treatment of chronic medical problems.   Diet: heart healthy Physical activity: active Depression/mood screen: negative Hearing: intact to whispered voice Visual acuity: grossly normal, performs annual eye exam  ADLs: capable Fall risk: none Home safety: good Cognitive evaluation: intact to orientation, naming, recall and repetition EOL planning: adv directives discussed  I have personally reviewed and have noted 1. The patient's medical and social history - reviewed today no changes 2. Their use of alcohol, tobacco or illicit drugs 3. Their current medications and supplements 4. The patient's functional ability including ADL's, fall risks, home safety risks and hearing or visual impairment. 5. Diet and physical activities 6. Evidence for depression or mood disorders 7. Care team reviewed and updated (available in snapshot)  Review of Systems  Constitutional: Negative for fever, activity change, appetite change and fatigue.  HENT: Negative.   Eyes: Negative.   Respiratory: Negative for cough, chest tightness, shortness of breath and wheezing.   Cardiovascular: Negative for chest pain, palpitations and leg swelling.  Gastrointestinal: Negative for nausea, abdominal pain, diarrhea and abdominal distention.  Musculoskeletal: Negative.   Skin: Negative.   Neurological: Negative.   Psychiatric/Behavioral: Negative.       Objective:   Physical Exam  Constitutional: She is oriented to person, place, and time. She appears well-developed and well-nourished.  HENT:  Head: Normocephalic and atraumatic.  Eyes: EOM are normal.  Neck: Normal range of motion.  Cardiovascular: Normal rate and regular rhythm.   Pulmonary/Chest: Effort normal and breath sounds normal. No respiratory distress. She has no  wheezes. She has no rales.  Abdominal: Soft. Bowel sounds are normal. She exhibits no distension. There is no tenderness.  Musculoskeletal: She exhibits no edema.  Neurological: She is alert and oriented to person, place, and time. Coordination normal.  Skin: Skin is warm and dry.  Psychiatric: She has a normal mood and affect.   Filed Vitals:   10/07/15 1354  BP: 116/62  Pulse: 70  Temp: 98.5 F (36.9 C)  TempSrc: Oral  Resp: 16  Height: 5\' 7"  (1.702 m)  Weight: 114 lb (51.71 kg)  SpO2: 98%      Assessment & Plan:  Given prevnar 13 and tdap at visit.

## 2015-10-28 ENCOUNTER — Encounter: Payer: Self-pay | Admitting: Gynecology

## 2015-11-25 ENCOUNTER — Encounter: Payer: Self-pay | Admitting: Gynecology

## 2015-11-25 ENCOUNTER — Ambulatory Visit (INDEPENDENT_AMBULATORY_CARE_PROVIDER_SITE_OTHER): Payer: Medicare Other | Admitting: Gynecology

## 2015-11-25 VITALS — BP 122/74 | Ht 66.0 in | Wt 115.0 lb

## 2015-11-25 DIAGNOSIS — N952 Postmenopausal atrophic vaginitis: Secondary | ICD-10-CM | POA: Diagnosis not present

## 2015-11-25 DIAGNOSIS — M810 Age-related osteoporosis without current pathological fracture: Secondary | ICD-10-CM

## 2015-11-25 DIAGNOSIS — Z01419 Encounter for gynecological examination (general) (routine) without abnormal findings: Secondary | ICD-10-CM

## 2015-11-25 NOTE — Progress Notes (Signed)
Sheryl Huang 04-Jul-1946 [id number]        70 y.o.  G1P1001  for breast and pelvic exam.  Past medical history,surgical history, problem list, medications, allergies, family history and social history were all reviewed and documented as reviewed in the EPIC chart.  ROS:  Performed with pertinent positives and negatives included in the history, assessment and plan.   Additional significant findings :  none   Exam: [name] assistant Filed Vitals:   11/25/15 0854  BP: 122/74  Height: 5\' 6"  (1.676 m)  Weight: 115 lb (52.164 kg)   General appearance:  Normal affect, orientation and appearance. Skin: Grossly normal HEENT: Without gross lesions.  No cervical or supraclavicular adenopathy. Thyroid normal.  Lungs:  Clear without wheezing, rales or rhonchi Cardiac: RR, without RMG Abdominal:  Soft, nontender, without masses, guarding, rebound, organomegaly or hernia Breasts:  Examined lying and sitting without masses, retractions, discharge or axillary adenopathy. Pelvic:  Ext/BUS/vagina atrophic  Cervix atrophic  Uterus anteverted, normal size, shape and contour, midline and mobile nontender   Adnexa  Without masses or tenderness    Anus and perineum  Normal   Rectovaginal  Normal sphincter tone without palpated masses or tenderness.    Assessment/Plan:  70 y.o. G51P1001 female for breast and pelvic exam.   1. Postmenopausal/atrophic genital changes.  Without significant hot flushes, night sweats, vaginal dryness or any vaginal bleeding. Continue to monitor and report any issues or vaginal bleeding. 2. Osteoporosis. DEXA 10/2014 T score -3.1. Stable from prior DEXA. History of Fosamax/Actonel for approximately 8 years discontinued 2010. Reviewed with patient although DEXA stable whether any benefit for treatment to reduce fracture risk. Patient active without other risk factors.  Patient prefers no treatment at this time but will repeat DEXA next year a two-year  interval. 3. Mammography coming due in April and I reminded her to schedule this. SBE monthly reviewed. 4. Pap smear 2014. No Pap smear done today. No history of abnormal Pap smears previously. Options to stop screening altogether versus less frequent screening intervals reviewed. Will readdress on an annual basis. 5. Colonoscopy 2014. Repeat at their recommended interval.  6. Health maintenance. No routine lab work done as this is done at her primary physician's office. Follow up 1 year, sooner as needed.   Anastasio Auerbach MD, 9:12 AM 11/25/2015

## 2015-11-25 NOTE — Patient Instructions (Signed)

## 2016-01-03 ENCOUNTER — Encounter: Payer: Self-pay | Admitting: Family Medicine

## 2016-01-03 ENCOUNTER — Ambulatory Visit (INDEPENDENT_AMBULATORY_CARE_PROVIDER_SITE_OTHER): Payer: Medicare Other | Admitting: Family Medicine

## 2016-01-03 VITALS — BP 118/62 | HR 78 | Temp 97.8°F | Ht 66.0 in | Wt 115.7 lb

## 2016-01-03 DIAGNOSIS — M545 Low back pain: Secondary | ICD-10-CM

## 2016-01-03 MED ORDER — CYCLOBENZAPRINE HCL 5 MG PO TABS: 5.0000 mg | ORAL_TABLET | Freq: Two times a day (BID) | ORAL | Status: DC | PRN

## 2016-01-03 MED ORDER — NAPROXEN 500 MG PO TABS: 500.0000 mg | ORAL_TABLET | Freq: Two times a day (BID) | ORAL | Status: DC | PRN

## 2016-01-03 NOTE — Patient Instructions (Signed)
Before you leave: -Low back exercises  Please schedule follow-up with her regular doctor in 2-3 weeks.  Use the naproxen and Flexeril as needed per instructions. Also may use heat 15 minutes twice daily and topical sports creams with Cessation or menthol.  Do the low back exercises 3-4 days per week as tolerated, ease into these slowly.  Follow-up sooner if he were having worsening symptoms, new symptoms or concerns.

## 2016-01-03 NOTE — Progress Notes (Signed)
Pre visit review using our clinic review tool, if applicable. No additional management support is needed unless otherwise documented below in the visit note. 

## 2016-01-03 NOTE — Progress Notes (Signed)
HPI:  Sheryl Huang is a very pleasant 70 year old here for an acute visit for low back pain. She reports she has a history of intermittent low back pain that is chronic. Reports she was told it may be arthritis or chronic low back pain. Reports she has had a flare of pain in the left low back that started about 2 weeks ago. Cannot remember inciting event. Pain is described as 6-7/10 at the worst, aching, worse with certain movements or positions and the left low back radiating to the left buttock and around to the side. She denies fevers, malaise, dysuria, hematuria, nausea, vomiting, changes in bowels, weakness or numbness or bowel or bladder incontinence. She reports heat has helped a little bit, along with ibuprofen.  ROS: See pertinent positives and negatives per HPI.  Past Medical History  Diagnosis Date  . Osteoporosis 10/2014    T score -3.1 stable from prior DEXA  . History of shingles     Past Surgical History  Procedure Laterality Date  . Ovarian cyst removal  1995  . Tubal ligation  1992  . Appendectomy  1969  . Colonoscopy  2014    neg  . Breast biopsy  2001    left and right    Family History  Problem Relation Age of Onset  . Hypertension Mother   . Stroke Mother 52  . Hypertension Father   . Heart attack Father   . Colon cancer Brother   . Diabetes Neg Hx   . Heart attack Paternal Grandmother     ? age  . Heart attack Maternal Uncle   . Coronary artery disease Paternal Aunt   . Coronary artery disease Paternal Uncle   . Heart attack Maternal Grandfather     Social History   Social History  . Marital Status: Married    Spouse Name: N/A  . Number of Children: N/A  . Years of Education: N/A   Social History Main Topics  . Smoking status: Never Smoker   . Smokeless tobacco: Never Used  . Alcohol Use: No  . Drug Use: No  . Sexual Activity: Yes    Birth Control/ Protection: Post-menopausal, Surgical     Comment: BTL-1st intercourse 70 yo-Fewer than 5 partners    Other Topics Concern  . None   Social History Narrative     Current outpatient prescriptions:  .  Calcium Carbonate-Vitamin D (CALCIUM 600 + D PO), Take 1 tablet by mouth 2 (two) times daily., Disp: , Rfl:  .  cholecalciferol (VITAMIN D) 1000 UNITS tablet, Take 1,000 Units by mouth daily., Disp: , Rfl:  .  POTASSIUM PO, Take 1 tablet by mouth daily. , Disp: , Rfl:  .  cyclobenzaprine (FLEXERIL) 5 MG tablet, Take 1 tablet (5 mg total) by mouth 2 (two) times daily as needed for muscle spasms., Disp: 30 tablet, Rfl: 0 .  naproxen (NAPROSYN) 500 MG tablet, Take 1 tablet (500 mg total) by mouth 2 (two) times daily as needed., Disp: 30 tablet, Rfl: 0  EXAM:  Filed Vitals:   01/03/16 0854  BP: 118/62  Pulse: 78  Temp: 97.8 F (36.6 C)    Body mass index is 18.68 kg/(m^2).  GENERAL: vitals reviewed and listed above, alert, oriented, appears well hydrated and in no acute distress  HEENT: atraumatic, conjunttiva clear, no obvious abnormalities on inspection of external nose and ears  NECK: no obvious masses on inspection  ABD: BS+, soft, NTTP  MS: moves all extremities without noticeable abnormality Normal  Gait Normal inspection of back except for scoliosis, resulting muscle spasm R L No bony TTP Soft tissue TTP at: L upper glutes, that reproduces her pain -/+ tests: neg trendelenburg,-facet loading, -SLRT, -CLRT, -FABER, -FADIR Normal muscle strength, sensation to light touch and DTRs in LEs bilaterally  PSYCH: pleasant and cooperative, no obvious depression or anxiety  ASSESSMENT AND PLAN:  Discussed the following assessment and plan:  Left low back pain, with sciatica presence unspecified  -we discussed possible serious and likely etiologies, workup and treatment, treatment risks and return precautions - suspect musculoskeletal etiology most likely with possible mild lumbar radicular pain vs muscle spasm -after this discussion, Freidy opted for conservative management  home exercises, analgesics, muscle relaxer and close follow-up -of course, we advised [name]  to return or notify a doctor immediately if symptoms worsen or persist or new concerns arise.   Patient Instructions  Before you leave: -Low back exercises  Please schedule follow-up with her regular doctor in 2-3 weeks.  Use the naproxen and Flexeril as needed per instructions. Also may use heat 15 minutes twice daily and topical sports creams with Cessation or menthol.  Do the low back exercises 3-4 days per week as tolerated, ease into these slowly.  Follow-up sooner if he were having worsening symptoms, new symptoms or concerns.       Colin Benton R.

## 2016-01-05 DIAGNOSIS — D225 Melanocytic nevi of trunk: Secondary | ICD-10-CM | POA: Diagnosis not present

## 2016-01-05 DIAGNOSIS — Z1283 Encounter for screening for malignant neoplasm of skin: Secondary | ICD-10-CM | POA: Diagnosis not present

## 2016-02-21 DIAGNOSIS — Z1231 Encounter for screening mammogram for malignant neoplasm of breast: Secondary | ICD-10-CM | POA: Diagnosis not present

## 2016-03-03 ENCOUNTER — Encounter: Payer: Self-pay | Admitting: Gynecology

## 2016-04-18 DIAGNOSIS — H2511 Age-related nuclear cataract, right eye: Secondary | ICD-10-CM | POA: Diagnosis not present

## 2016-04-18 DIAGNOSIS — H1851 Endothelial corneal dystrophy: Secondary | ICD-10-CM | POA: Diagnosis not present

## 2016-04-18 DIAGNOSIS — H04123 Dry eye syndrome of bilateral lacrimal glands: Secondary | ICD-10-CM | POA: Diagnosis not present

## 2016-04-18 DIAGNOSIS — H40013 Open angle with borderline findings, low risk, bilateral: Secondary | ICD-10-CM | POA: Diagnosis not present

## 2016-04-18 DIAGNOSIS — H25812 Combined forms of age-related cataract, left eye: Secondary | ICD-10-CM | POA: Diagnosis not present

## 2016-04-18 DIAGNOSIS — H10413 Chronic giant papillary conjunctivitis, bilateral: Secondary | ICD-10-CM | POA: Diagnosis not present

## 2016-04-18 DIAGNOSIS — H16143 Punctate keratitis, bilateral: Secondary | ICD-10-CM | POA: Diagnosis not present

## 2016-05-08 DIAGNOSIS — H2512 Age-related nuclear cataract, left eye: Secondary | ICD-10-CM | POA: Diagnosis not present

## 2016-05-08 DIAGNOSIS — H2511 Age-related nuclear cataract, right eye: Secondary | ICD-10-CM | POA: Diagnosis not present

## 2016-06-12 DIAGNOSIS — H2511 Age-related nuclear cataract, right eye: Secondary | ICD-10-CM | POA: Diagnosis not present

## 2016-08-08 DIAGNOSIS — Z23 Encounter for immunization: Secondary | ICD-10-CM | POA: Diagnosis not present

## 2016-09-04 ENCOUNTER — Telehealth: Payer: Self-pay | Admitting: Internal Medicine

## 2016-09-04 NOTE — Telephone Encounter (Signed)
Called Sheryl Huang to schedule awv appt. Left msg for pt to call office to schedule appt.

## 2016-09-19 NOTE — Telephone Encounter (Signed)
2nd call to Mrs. Cadden to schedule awv appt. Left msg for pt to call office.

## 2016-11-27 ENCOUNTER — Encounter: Payer: Self-pay | Admitting: Gynecology

## 2016-11-27 ENCOUNTER — Ambulatory Visit (INDEPENDENT_AMBULATORY_CARE_PROVIDER_SITE_OTHER): Payer: Medicare Other | Admitting: Gynecology

## 2016-11-27 VITALS — BP 128/70 | Ht 65.75 in | Wt 115.0 lb

## 2016-11-27 DIAGNOSIS — Z01411 Encounter for gynecological examination (general) (routine) with abnormal findings: Secondary | ICD-10-CM | POA: Diagnosis not present

## 2016-11-27 DIAGNOSIS — R159 Full incontinence of feces: Secondary | ICD-10-CM

## 2016-11-27 DIAGNOSIS — N952 Postmenopausal atrophic vaginitis: Secondary | ICD-10-CM

## 2016-11-27 DIAGNOSIS — M810 Age-related osteoporosis without current pathological fracture: Secondary | ICD-10-CM

## 2016-11-27 DIAGNOSIS — M25552 Pain in left hip: Secondary | ICD-10-CM | POA: Diagnosis not present

## 2016-11-27 NOTE — Progress Notes (Signed)
Sheryl Huang 04/27/1946 [id number]        71 y.o.  G1P1001 for breast and pelvic exam. Patient also complaining of fecal incontinence particularly with diarrhea over the past year or so. Notes some staining on the pad that she wears. No large volume losses. No issues with urinary incontinence. Patient also notes an area of numbness with some discomfort that develops in her left hip area standing or sitting. Whenever she lays down the seems to get better. No radiation down her leg. No muscle weakness in the lower extremity.  Past medical history,surgical history, problem list, medications, allergies, family history and social history were all reviewed and documented as reviewed in the EPIC chart.  ROS:  Performed with pertinent positives and negatives included in the history, assessment and plan.   Additional significant findings :  None   Exam: Copywriter, advertising Vitals:   11/27/16 0921  BP: 128/70  Weight: 115 lb (52.2 kg)  Height: 5' 5.75" (1.67 m)   Body mass index is 18.7 kg/m.  General appearance:  Normal affect, orientation and appearance. Skin: Grossly normal HEENT: Without gross lesions.  No cervical or supraclavicular adenopathy. Thyroid normal.  Lungs:  Clear without wheezing, rales or rhonchi Cardiac: RR, without RMG Abdominal:  Soft, nontender, without masses, guarding, rebound, organomegaly or hernia Breasts:  Examined lying and sitting without masses, retractions, discharge or axillary adenopathy. Pelvic:  Ext, BUS, Vagina with atrophic changes  Cervix with atrophic changes. Pap smear done  Uterus anteverted, normal size, shape and contour, midline and mobile nontender   Adnexa without masses or tenderness    Anus and perineum normal   Rectovaginal sphincter tone mildly diminished without palpated masses or tenderness.    Assessment/Plan:  71 y.o. G34P1001 female for breast and pelvic exam.   1. Numbness/discomfort left hip region. Exam overall was normal.  Reviewed differential to include possible degenerative changes in the lumbar spine with radiation to the hip region versus primary hip disorder. No significant muscle weakness. Improves with rest. Options to include orthopedic referral versus observation for now discussed. At this point patient does not feel it's overly bothersome to her and prefers to monitor. Will call for referral if continues. 2. Fecal incontinence primarily with liquid stool. Sphincter tone mildly diminished. No sphincter defects noted. Reviewed with patient not unusual with aging to have loss of muscle tone results in some fecal incontinence. does not appear to have neurologic component with normal sensation surrounding. Options for management reviewed. At this point recommended daily fiber supplement to keep stools bulkier to see if this doesn't decrease the issue. Referral to surgery for testing and possible surgical options discussed. At this point patient prefers just to monitor and try the extra fiber. Will call if she desires referral. 3. Mammogram coming due this April and I reminded her to schedule. SBE monthly reviewed. 4. Pap smear 2014. Pap smear done today.  No history of abnormal Pap smears previously. 5. Colonoscopy 2014. Repeat at their recommended interval. 6. Osteoporosis. DEXA 10/2014 T score -3.1. History of 8 years of Fosamax/Actonel. Discontinued 2010. Schedule DEXA now patient will follow up for this.  I previously discussed treatment based on T score and patient declined. 7. Health maintenance. No routine lab work done as patient does this elsewhere. Follow up for the DEXA otherwise follow up in one year, sooner if any issues.  Additional time in excess of her breast and pelvic exam was spent in direct face to face counseling and coordination  of care in regards to her left hip symptoms and fecal incontinence.    Anastasio Auerbach MD, 9:43 AM 11/27/2016

## 2016-11-27 NOTE — Addendum Note (Signed)
Addended by: Thurnell Garbe A on: 11/27/2016 10:00 AM   Modules accepted: Orders

## 2016-11-27 NOTE — Patient Instructions (Signed)
Followup for bone density as scheduled. 

## 2016-11-28 LAB — PAP IG W/ RFLX HPV ASCU

## 2016-12-21 ENCOUNTER — Ambulatory Visit (INDEPENDENT_AMBULATORY_CARE_PROVIDER_SITE_OTHER): Payer: Medicare Other

## 2016-12-21 DIAGNOSIS — M810 Age-related osteoporosis without current pathological fracture: Secondary | ICD-10-CM | POA: Diagnosis not present

## 2016-12-25 ENCOUNTER — Telehealth: Payer: Self-pay | Admitting: Gynecology

## 2016-12-25 NOTE — Telephone Encounter (Signed)
Tell patient her most recent bone density shows osteoporosis with loss from her prior study in the left hip. Recommend she consider reinitiation of treatment and that we consider Prolia. Recommend office visit to discuss.

## 2016-12-26 ENCOUNTER — Other Ambulatory Visit: Payer: Self-pay | Admitting: Gynecology

## 2016-12-26 DIAGNOSIS — M810 Age-related osteoporosis without current pathological fracture: Secondary | ICD-10-CM

## 2016-12-26 NOTE — Telephone Encounter (Signed)
Pt informed with the below note, will call back to schedule appointment

## 2016-12-26 NOTE — Telephone Encounter (Signed)
Left message for pt to call.

## 2017-01-22 ENCOUNTER — Telehealth: Payer: Self-pay | Admitting: Gynecology

## 2017-01-22 ENCOUNTER — Ambulatory Visit (INDEPENDENT_AMBULATORY_CARE_PROVIDER_SITE_OTHER): Payer: Medicare Other | Admitting: Gynecology

## 2017-01-22 ENCOUNTER — Encounter: Payer: Self-pay | Admitting: Gynecology

## 2017-01-22 VITALS — BP 112/68

## 2017-01-22 DIAGNOSIS — M810 Age-related osteoporosis without current pathological fracture: Secondary | ICD-10-CM

## 2017-01-22 NOTE — Patient Instructions (Signed)
The office will call you to arrange for Prolia. Call the office if you do not hear from [place] within 2 weeks

## 2017-01-22 NOTE — Telephone Encounter (Signed)
Note from Dr Phineas Real. Arrange for Prolia diagnosis osteoporosis with prior bisphosphonate use

## 2017-01-22 NOTE — Progress Notes (Signed)
    Sheryl Huang [birth year] [id number]        71 y.o.  G1P1001 presents to discuss her most recent bone density which shows a T score -3.8 distal third of the radius. 7% loss at the left hip statistically significant. Stable right hip. Spine was not calculated due to degenerative changes and scoliosis. History of bisphosphate use for approximately 8 years discontinuing 2010. No recent vitamin D level through her primary physician's office.  Past medical history,surgical history, problem list, medications, allergies, family history and social history were all reviewed and documented in the EPIC chart.  Directed ROS with pertinent positives and negatives documented in the history of present illness/assessment and plan.  Exam: Vitals:   01/22/17 1029  BP: 112/68   General appearance:  Normal   Assessment/Plan:  71 y.o. G1P1001 with osteoporosis and a statistically significant decline at the left hip. Prior bisphosphate use for 8 years. Options for management reviewed. Patient does know she is not active as far as walking as she used to be. Plans to reinitiate increasing walking. Also check vitamin D level today as this has not been checked recently. Options for medication reviewed to include reinitiation of a bisphosphate either orally or Reclast. Use of Prolia also reviewed. Evista and Forteo discussed. The pros and cons of each discussed. Risks to include osteonecrosis of the jaw, atypical fractures, GERD, rashes, infections, thrombosis risks reviewed. After lengthy discussion the patient agrees with trial of Prolia. I feel changing the class of drugs would be beneficial given she received 8 years of bisphosphate previously. Patient will make arrangements for this and knows to call me if she does not hear from my office within several weeks. We'll also follow up on her vitamin D level that was drawn today.  Greater than 50% of my time was spent in direct face to face counseling and coordination  of care with the patient.     Anastasio Auerbach MD, 10:48 AM 01/22/2017

## 2017-01-23 LAB — VITAMIN D 25 HYDROXY (VIT D DEFICIENCY, FRACTURES): Vit D, 25-Hydroxy: 52 ng/mL (ref 30–100)

## 2017-01-29 NOTE — Telephone Encounter (Signed)
Complete exam TF 11/27/16  Calcium from  from 2016 no recent visit with PCP. She will come in  01/30/17 at 9 am for calcium . Insurance benefits. Deductible met., secondary coverage cost to pt $0.

## 2017-01-30 ENCOUNTER — Other Ambulatory Visit: Payer: Medicare Other

## 2017-01-30 DIAGNOSIS — M810 Age-related osteoporosis without current pathological fracture: Secondary | ICD-10-CM

## 2017-01-30 LAB — CALCIUM: Calcium: 9.8 mg/dL (ref 8.6–10.4)

## 2017-01-30 NOTE — Telephone Encounter (Signed)
Calcium 9.8  01/30/17  Appointment 02/02/17  At 9 am prolia injection

## 2017-01-31 NOTE — Telephone Encounter (Signed)
pc to pt confirmed injection time and date

## 2017-02-01 ENCOUNTER — Encounter: Payer: Self-pay | Admitting: Gynecology

## 2017-02-02 ENCOUNTER — Ambulatory Visit (INDEPENDENT_AMBULATORY_CARE_PROVIDER_SITE_OTHER): Payer: Medicare Other | Admitting: Gynecology

## 2017-02-02 DIAGNOSIS — M810 Age-related osteoporosis without current pathological fracture: Secondary | ICD-10-CM | POA: Diagnosis not present

## 2017-02-02 MED ORDER — DENOSUMAB 60 MG/ML ~~LOC~~ SOLN
60.0000 mg | Freq: Once | SUBCUTANEOUS | Status: AC
  Administered 2017-02-02: 60 mg via SUBCUTANEOUS

## 2017-02-05 NOTE — Telephone Encounter (Signed)
Next injection Prolia due 08/05/17

## 2017-02-22 ENCOUNTER — Encounter: Payer: Self-pay | Admitting: Gynecology

## 2017-02-22 DIAGNOSIS — Z1231 Encounter for screening mammogram for malignant neoplasm of breast: Secondary | ICD-10-CM | POA: Diagnosis not present

## 2017-07-12 DIAGNOSIS — H1851 Endothelial corneal dystrophy: Secondary | ICD-10-CM | POA: Diagnosis not present

## 2017-07-12 DIAGNOSIS — Z961 Presence of intraocular lens: Secondary | ICD-10-CM | POA: Diagnosis not present

## 2017-07-12 DIAGNOSIS — H40013 Open angle with borderline findings, low risk, bilateral: Secondary | ICD-10-CM | POA: Diagnosis not present

## 2017-08-01 DIAGNOSIS — Z23 Encounter for immunization: Secondary | ICD-10-CM | POA: Diagnosis not present

## 2017-08-06 ENCOUNTER — Telehealth: Payer: Self-pay | Admitting: Gynecology

## 2017-08-06 NOTE — Telephone Encounter (Signed)
Prolia injection due 08/05/17

## 2017-08-07 NOTE — Telephone Encounter (Signed)
Prolia due 08/05/17  , Calcium  9.3  01/30/17,  Complete TF 10/2016  Insurance primary Medicare $183 deduc met, 20% co insurance secondary insurance pay 100% . Cost to pt $0 . 08/08/17  At 9 30.

## 2017-08-08 ENCOUNTER — Encounter: Payer: Self-pay | Admitting: Gynecology

## 2017-08-08 ENCOUNTER — Ambulatory Visit (INDEPENDENT_AMBULATORY_CARE_PROVIDER_SITE_OTHER): Payer: Medicare Other | Admitting: Gynecology

## 2017-08-08 DIAGNOSIS — M810 Age-related osteoporosis without current pathological fracture: Secondary | ICD-10-CM | POA: Diagnosis not present

## 2017-08-08 MED ORDER — DENOSUMAB 60 MG/ML ~~LOC~~ SOLN
60.0000 mg | Freq: Once | SUBCUTANEOUS | Status: AC
  Administered 2017-08-08: 60 mg via SUBCUTANEOUS

## 2017-08-09 NOTE — Telephone Encounter (Signed)
Prolia given 08/08/17  Next injection 02/07/18

## 2017-09-14 ENCOUNTER — Encounter: Payer: Self-pay | Admitting: Family Medicine

## 2017-09-14 ENCOUNTER — Ambulatory Visit (INDEPENDENT_AMBULATORY_CARE_PROVIDER_SITE_OTHER): Payer: Medicare Other | Admitting: Family Medicine

## 2017-09-14 VITALS — BP 122/68 | HR 80 | Temp 98.2°F | Resp 14 | Ht 67.0 in | Wt 117.0 lb

## 2017-09-14 DIAGNOSIS — M5416 Radiculopathy, lumbar region: Secondary | ICD-10-CM | POA: Diagnosis not present

## 2017-09-14 DIAGNOSIS — J019 Acute sinusitis, unspecified: Secondary | ICD-10-CM

## 2017-09-14 DIAGNOSIS — Z13220 Encounter for screening for lipoid disorders: Secondary | ICD-10-CM | POA: Diagnosis not present

## 2017-09-14 DIAGNOSIS — B9689 Other specified bacterial agents as the cause of diseases classified elsewhere: Secondary | ICD-10-CM | POA: Diagnosis not present

## 2017-09-14 DIAGNOSIS — M4120 Other idiopathic scoliosis, site unspecified: Secondary | ICD-10-CM

## 2017-09-14 DIAGNOSIS — M810 Age-related osteoporosis without current pathological fracture: Secondary | ICD-10-CM | POA: Diagnosis not present

## 2017-09-14 DIAGNOSIS — Z0000 Encounter for general adult medical examination without abnormal findings: Secondary | ICD-10-CM | POA: Diagnosis not present

## 2017-09-14 LAB — CBC WITH DIFFERENTIAL/PLATELET
BASOS ABS: 42 {cells}/uL (ref 0–200)
Basophils Relative: 0.7 %
EOS ABS: 120 {cells}/uL (ref 15–500)
EOS PCT: 2 %
HEMATOCRIT: 38.8 % (ref 35.0–45.0)
Hemoglobin: 13.2 g/dL (ref 11.7–15.5)
LYMPHS ABS: 2118 {cells}/uL (ref 850–3900)
MCH: 30.9 pg (ref 27.0–33.0)
MCHC: 34 g/dL (ref 32.0–36.0)
MCV: 90.9 fL (ref 80.0–100.0)
MPV: 11.6 fL (ref 7.5–12.5)
Monocytes Relative: 8.8 %
Neutro Abs: 3192 cells/uL (ref 1500–7800)
Neutrophils Relative %: 53.2 %
Platelets: 210 10*3/uL (ref 140–400)
RBC: 4.27 10*6/uL (ref 3.80–5.10)
RDW: 12.3 % (ref 11.0–15.0)
Total Lymphocyte: 35.3 %
WBC mixed population: 528 cells/uL (ref 200–950)
WBC: 6 10*3/uL (ref 3.8–10.8)

## 2017-09-14 LAB — LIPID PANEL
Cholesterol: 247 mg/dL — ABNORMAL HIGH (ref <200)
HDL: 68 mg/dL (ref >50)
LDL Cholesterol (Calc): 156 mg/dL (calc) — ABNORMAL HIGH
NON-HDL CHOLESTEROL (CALC): 179 mg/dL — AB (ref <130)
Total CHOL/HDL Ratio: 3.6 (calc) (ref <5.0)
Triglycerides: 109 mg/dL (ref <150)

## 2017-09-14 LAB — COMPLETE METABOLIC PANEL WITH GFR
AG Ratio: 1.7 (calc) (ref 1.0–2.5)
ALT: 11 U/L (ref 6–29)
AST: 21 U/L (ref 10–35)
Albumin: 4.2 g/dL (ref 3.6–5.1)
Alkaline phosphatase (APISO): 53 U/L (ref 33–130)
BILIRUBIN TOTAL: 0.7 mg/dL (ref 0.2–1.2)
BUN: 16 mg/dL (ref 7–25)
CO2: 29 mmol/L (ref 20–32)
Calcium: 9.3 mg/dL (ref 8.6–10.4)
Chloride: 104 mmol/L (ref 98–110)
Creat: 0.78 mg/dL (ref 0.60–0.93)
GFR, EST NON AFRICAN AMERICAN: 77 mL/min/{1.73_m2} (ref >=60)
GFR, Est African American: 89 mL/min/{1.73_m2} (ref >=60)
GLOBULIN: 2.5 g/dL (ref 1.9–3.7)
Glucose, Bld: 84 mg/dL (ref 65–99)
Potassium: 4.3 mmol/L (ref 3.5–5.3)
SODIUM: 139 mmol/L (ref 135–146)
TOTAL PROTEIN: 6.7 g/dL (ref 6.1–8.1)

## 2017-09-14 MED ORDER — PREDNISONE 20 MG PO TABS: ORAL_TABLET | ORAL | 0 refills | Status: DC

## 2017-09-14 MED ORDER — AMOXICILLIN-POT CLAVULANATE 875-125 MG PO TABS: 1.0000 | ORAL_TABLET | Freq: Two times a day (BID) | ORAL | 0 refills | Status: DC

## 2017-09-14 NOTE — Progress Notes (Signed)
Subjective:    Patient ID: Sheryl Huang, female    DOB: [birth year], 71 y.o.   MRN: [id number]  HPI  Patient is here today to establish care.  Past medical history is significant for moderate to severe scoliosis.  She also has a history of osteoporosis and is currently receiving Prolia injections every 6 under the care of her gynecologist Dr. Phineas Real.  She complains of chronic daily back pain.  Over the last 3-4 months, she is developed pain radiating from her lower back into her posterior left hip and down her left lateral hip.  The pain is burning and stinging in nature and does not improve with naproxen or other anti-inflammatories.  She is interested in getting an MRI to determine if she has nerve impingement on the left side causing her pain stemming from her underlying scoliosis.  She also presents with 2-3 weeks of sinus pressure in her left frontal and left maxillary sinus.  She is tender to palpation in the left frontal and left maxillary sinus.  She reports rhinorrhea and occasional dizziness.  She reports dull sinus headaches and complains of mild subjective fever.  She is tried over-the-counter cold medication with no relief.  She is due for Pneumovax 23.  The remainder of her immunizations are up-to-date.  Colonoscopy is up-to-date and is due again next year.  She gets her Pap smears under the care of her gynecologist.  Her mammogram is up-to-date and is performed by her gynecologist as well. Past Medical History:  Diagnosis Date  . History of shingles   . Osteoporosis 11/2016   T score 3.8   Past Surgical History:  Procedure Laterality Date  . APPENDECTOMY  1969  . BREAST BIOPSY  2001   left and right  . COLONOSCOPY  2014   neg  . OVARIAN CYST REMOVAL  1995  . TUBAL LIGATION  1992    Current Outpatient Medications on File Prior to Visit  Medication Sig Dispense Refill  . Calcium Carbonate-Vitamin D (CALCIUM 600 + D PO) Take 1 tablet by mouth 2 (two) times daily.    .  cholecalciferol (VITAMIN D) 1000 UNITS tablet Take 1,000 Units by mouth daily.    . cyclobenzaprine (FLEXERIL) 5 MG tablet Take 1 tablet (5 mg total) by mouth 2 (two) times daily as needed for muscle spasms. 30 tablet 0  . naproxen (NAPROSYN) 500 MG tablet Take 1 tablet (500 mg total) by mouth 2 (two) times daily as needed. 30 tablet 0  . POTASSIUM PO Take 1 tablet by mouth daily.      No current facility-administered medications on file prior to visit.    Allergies  Allergen Reactions  . Erythromycin     REACTION: SEVERE GI UPSET   Social History   Socioeconomic History  . Marital status: Married    Spouse name: Not on file  . Number of children: Not on file  . Years of education: Not on file  . Highest education level: Not on file  Social Needs  . Financial resource strain: Not on file  . Food insecurity - worry: Not on file  . Food insecurity - inability: Not on file  . Transportation needs - medical: Not on file  . Transportation needs - non-medical: Not on file  Occupational History  . Not on file  Tobacco Use  . Smoking status: Never Smoker  . Smokeless tobacco: Never Used  Substance and Sexual Activity  . Alcohol use: No    Alcohol/week:  0.0 oz  . Drug use: No  . Sexual activity: Yes    Birth control/protection: Post-menopausal, Surgical    Comment: BTL-1st intercourse 71 yo-Fewer than 5 partners  Other Topics Concern  . Not on file  Social History Narrative  . Not on file   Family History  Problem Relation Age of Onset  . Hypertension Mother   . Stroke Mother 60  . Hypertension Father   . Heart attack Father   . Colon cancer Brother   . Heart attack Paternal Grandmother        ? age  . Heart attack Maternal Uncle   . Coronary artery disease Paternal Aunt   . Coronary artery disease Paternal Uncle   . Heart attack Maternal Grandfather   . Diabetes Neg Hx     Review of Systems  All other systems reviewed and are negative.      Objective:    Physical Exam  Constitutional: She is oriented to person, place, and time. She appears well-developed and well-nourished. No distress.  HENT:  Head: Normocephalic and atraumatic.  Right Ear: External ear normal.  Left Ear: External ear normal.  Nose: Mucosal edema and rhinorrhea present. Left sinus exhibits maxillary sinus tenderness and frontal sinus tenderness.  Mouth/Throat: Oropharynx is clear and moist. No oropharyngeal exudate.  Eyes: Conjunctivae and EOM are normal. Pupils are equal, round, and reactive to light. Right eye exhibits no discharge. Left eye exhibits no discharge. No scleral icterus.  Neck: Normal range of motion. Neck supple. No JVD present. No tracheal deviation present. No thyromegaly present.  Cardiovascular: Normal rate, regular rhythm, normal heart sounds and intact distal pulses. Exam reveals no gallop and no friction rub.  No murmur heard. Pulmonary/Chest: Effort normal and breath sounds normal. No stridor. No respiratory distress. She has no wheezes. She has no rales. She exhibits no tenderness.  Abdominal: Soft. Bowel sounds are normal. She exhibits no distension and no mass. There is no tenderness. There is no rebound and no guarding.  Genitourinary: Vagina normal and uterus normal.  Musculoskeletal: Normal range of motion. She exhibits no edema.       Thoracic back: She exhibits tenderness and deformity.       Lumbar back: She exhibits bony tenderness and deformity.       Back:  Lymphadenopathy:    She has no cervical adenopathy.  Neurological: She is alert and oriented to person, place, and time. She has normal reflexes. She displays normal reflexes. No cranial nerve deficit. She exhibits normal muscle tone. Coordination normal.  Skin: Skin is warm. No rash noted. She is not diaphoretic. No erythema. No pallor.  Psychiatric: She has a normal mood and affect. Her behavior is normal. Judgment and thought content normal.  Vitals reviewed. scoliiosis          Assessment & Plan:  Cholera screening  Screening for cholesterol level - Plan: CBC with Differential/Platelet, COMPLETE METABOLIC PANEL WITH GFR, Lipid panel  Acute bacterial rhinosinusitis - Plan: amoxicillin-clavulanate (AUGMENTIN) 875-125 MG tablet, predniSONE (DELTASONE) 20 MG tablet  Routine general medical examination at a health care facility  SCOLIOSIS  Osteoporosis without current pathological fracture, unspecified osteoporosis type  Left lumbar radiculopathy   Cholera screening is a type error and will be deleted.  Patient has sinusitis.  We will treat her with Augmentin 875 mg p.o. twice daily and a prednisone taper pack.  I recommended she return when she feels better for her Pneumovax 23.  I will also screen her cholesterol  with a CBC, CMP, fasting lipid panel.  The remainder of her cancer screening is up-to-date.  Her hepatitis C and HIV screening is up-to-date.  Her other vaccines are up-to-date.  Her colonoscopy is up-to-date.  Regular anticipatory guidance is provided.  Patient would like to wait at the present time but if the pain in her back worsens, I would recommend an MRI of the lumbar spine to evaluate further for possible left lumbar radiculopathy

## 2017-09-24 ENCOUNTER — Telehealth: Payer: Self-pay | Admitting: Family Medicine

## 2017-09-25 ENCOUNTER — Encounter: Payer: Self-pay | Admitting: Family Medicine

## 2017-10-05 ENCOUNTER — Ambulatory Visit (INDEPENDENT_AMBULATORY_CARE_PROVIDER_SITE_OTHER): Payer: Medicare Other

## 2017-10-05 DIAGNOSIS — Z23 Encounter for immunization: Secondary | ICD-10-CM

## 2017-10-05 NOTE — Progress Notes (Signed)
Patient was in office to receive her pneumovax 23 vaccine.Patient received the vaccine in her right deltoid. Patient tolerated well

## 2017-11-28 ENCOUNTER — Encounter: Payer: Medicare Other | Admitting: Gynecology

## 2017-12-19 DIAGNOSIS — L710 Perioral dermatitis: Secondary | ICD-10-CM | POA: Diagnosis not present

## 2017-12-19 DIAGNOSIS — L821 Other seborrheic keratosis: Secondary | ICD-10-CM | POA: Diagnosis not present

## 2017-12-26 ENCOUNTER — Telehealth: Payer: Self-pay | Admitting: *Deleted

## 2017-12-26 NOTE — Telephone Encounter (Signed)
Prolia insurance verification has been sent awaiting Summary of benefits  

## 2017-12-28 ENCOUNTER — Encounter: Payer: Self-pay | Admitting: Gynecology

## 2017-12-28 NOTE — Telephone Encounter (Addendum)
Deductible $185 ($13mt)  OOP MAX Amount met  Annual exam 01/10/18  Prolia due 02/07/18  Calcium 9.3            Date 09/14/17  Upcoming dental procedures   Prior Authorization needed NO  Pt estimated Cost $0   SOB scanned in epic     Coverage Details:  This is a [employer] Plan and it covers the medicare Part B deductible and 100% of the excess charges

## 2018-01-01 ENCOUNTER — Telehealth: Payer: Self-pay

## 2018-01-01 DIAGNOSIS — M5416 Radiculopathy, lumbar region: Secondary | ICD-10-CM

## 2018-01-01 NOTE — Telephone Encounter (Signed)
Patient called and states she is having numbness on her side and would like to go ahead and have the MRI done that was discussed at her office visit in November 2018.  Patient would like to know if order can be put in or if she will need to come back in for an appointment

## 2018-01-01 NOTE — Telephone Encounter (Signed)
Please order MRI of lumbar spine for lumbar radiculopathy > 6 weeks failing conservative therapy.

## 2018-01-02 NOTE — Telephone Encounter (Signed)
Referral put in patient is aware

## 2018-01-10 ENCOUNTER — Ambulatory Visit (INDEPENDENT_AMBULATORY_CARE_PROVIDER_SITE_OTHER): Payer: Medicare Other | Admitting: Gynecology

## 2018-01-10 ENCOUNTER — Encounter: Payer: Self-pay | Admitting: Gynecology

## 2018-01-10 VITALS — BP 118/74 | Ht 66.0 in | Wt 117.0 lb

## 2018-01-10 DIAGNOSIS — M810 Age-related osteoporosis without current pathological fracture: Secondary | ICD-10-CM

## 2018-01-10 DIAGNOSIS — N952 Postmenopausal atrophic vaginitis: Secondary | ICD-10-CM

## 2018-01-10 DIAGNOSIS — Z01411 Encounter for gynecological examination (general) (routine) with abnormal findings: Secondary | ICD-10-CM

## 2018-01-10 NOTE — Patient Instructions (Addendum)
Continue with Prolia this coming year every 6 months.  Follow-up for annual exam in 1 year.  We will plan on repeating your bone density next year

## 2018-01-10 NOTE — Progress Notes (Signed)
    Sheryl Huang 10-13-1946 [id number]        71 y.o.  G1P1001 for breast and pelvic exam.  Without gynecologic complaints.  Past medical history,surgical history, problem list, medications, allergies, family history and social history were all reviewed and documented as reviewed in the EPIC chart.  ROS:  Performed with pertinent positives and negatives included in the history, assessment and plan.   Additional significant findings : None   Exam: [name] assistant Vitals:   01/10/18 0752  BP: 118/74  Weight: 117 lb (53.1 kg)  Height: 5\' 6"  (1.676 m)   Body mass index is 18.88 kg/m.  General appearance:  Normal affect, orientation and appearance. Skin: Grossly normal HEENT: Without gross lesions.  No cervical or supraclavicular adenopathy. Thyroid normal.  Lungs:  Clear without wheezing, rales or rhonchi Cardiac: RR, without RMG Abdominal:  Soft, nontender, without masses, guarding, rebound, organomegaly or hernia Breasts:  Examined lying and sitting without masses, retractions, discharge or axillary adenopathy. Pelvic:  Ext, BUS, Vagina: With atrophic changes  Cervix: With atrophic changes  Uterus: Anteverted, normal size, shape and contour, midline and mobile nontender   Adnexa: Without masses or tenderness    Anus and perineum: Normal   Rectovaginal: Normal sphincter tone without palpated masses or tenderness.    Assessment/Plan:  72 y.o. G77P1001 female for breast and pelvic exam.   1. Postmenopausal/atrophic genital changes.  No significant hot flushes, night sweats, vaginal dryness or any vaginal bleeding. 2. Osteoporosis.  DEXA 12/2016 T score -3.8.  Started Prolia last year and is done well with this.  We will continue with Prolia this year and plan on repeat bone density next year at a 2-year interval. 3. Pap smear 2018.  No Pap smear done today.  No history of abnormal Pap smears.  Options to stop screening per current screening guidelines based on age reviewed.   Will readdress on an annual basis. 4. Mammography coming due end of April and I reminded her to schedule this.  Breast exam normal today. 5. Colonoscopy 2014.  Repeat at their recommended interval. 6. Health maintenance.  No routine lab work done as patient has this done elsewhere.  Follow-up 1 year, sooner as needed.   Anastasio Auerbach MD, 8:18 AM 01/10/2018

## 2018-01-25 ENCOUNTER — Ambulatory Visit
Admission: RE | Admit: 2018-01-25 | Discharge: 2018-01-25 | Disposition: A | Discharge status: Home / Self Care | Payer: Medicare Other | Source: Ambulatory Visit | Attending: Family Medicine | Admitting: Family Medicine

## 2018-01-25 DIAGNOSIS — M5416 Radiculopathy, lumbar region: Secondary | ICD-10-CM

## 2018-01-25 DIAGNOSIS — M48061 Spinal stenosis, lumbar region without neurogenic claudication: Secondary | ICD-10-CM | POA: Diagnosis not present

## 2018-01-29 ENCOUNTER — Other Ambulatory Visit: Payer: Self-pay | Admitting: Family Medicine

## 2018-01-29 DIAGNOSIS — M5416 Radiculopathy, lumbar region: Secondary | ICD-10-CM

## 2018-01-29 NOTE — Progress Notes (Signed)
referr

## 2018-02-07 ENCOUNTER — Ambulatory Visit (INDEPENDENT_AMBULATORY_CARE_PROVIDER_SITE_OTHER): Payer: Medicare Other | Admitting: Gynecology

## 2018-02-07 DIAGNOSIS — M810 Age-related osteoporosis without current pathological fracture: Secondary | ICD-10-CM

## 2018-02-07 MED ORDER — DENOSUMAB 60 MG/ML ~~LOC~~ SOLN
60.0000 mg | Freq: Once | SUBCUTANEOUS | Status: AC
  Administered 2018-02-07: 60 mg via SUBCUTANEOUS

## 2018-02-07 NOTE — Telephone Encounter (Signed)
PROLIA GIVEN 02/07/18 NEXT INJECTION 08/10/18

## 2018-02-25 DIAGNOSIS — Z1231 Encounter for screening mammogram for malignant neoplasm of breast: Secondary | ICD-10-CM | POA: Diagnosis not present

## 2018-02-25 DIAGNOSIS — M4186 Other forms of scoliosis, lumbar region: Secondary | ICD-10-CM | POA: Diagnosis not present

## 2018-02-25 DIAGNOSIS — M5136 Other intervertebral disc degeneration, lumbar region: Secondary | ICD-10-CM | POA: Diagnosis not present

## 2018-02-27 DIAGNOSIS — M5416 Radiculopathy, lumbar region: Secondary | ICD-10-CM | POA: Diagnosis not present

## 2018-03-03 ENCOUNTER — Encounter: Payer: Self-pay | Admitting: Gastroenterology

## 2018-03-05 DIAGNOSIS — M5416 Radiculopathy, lumbar region: Secondary | ICD-10-CM | POA: Diagnosis not present

## 2018-03-12 DIAGNOSIS — M5416 Radiculopathy, lumbar region: Secondary | ICD-10-CM | POA: Diagnosis not present

## 2018-03-14 DIAGNOSIS — M5416 Radiculopathy, lumbar region: Secondary | ICD-10-CM | POA: Diagnosis not present

## 2018-03-21 DIAGNOSIS — M5416 Radiculopathy, lumbar region: Secondary | ICD-10-CM | POA: Diagnosis not present

## 2018-03-26 DIAGNOSIS — M5416 Radiculopathy, lumbar region: Secondary | ICD-10-CM | POA: Diagnosis not present

## 2018-03-28 DIAGNOSIS — G894 Chronic pain syndrome: Secondary | ICD-10-CM | POA: Diagnosis not present

## 2018-04-02 DIAGNOSIS — M5416 Radiculopathy, lumbar region: Secondary | ICD-10-CM | POA: Diagnosis not present

## 2018-05-08 ENCOUNTER — Encounter: Payer: Self-pay | Admitting: Gastroenterology

## 2018-06-26 ENCOUNTER — Ambulatory Visit (AMBULATORY_SURGERY_CENTER): Payer: Self-pay | Admitting: *Deleted

## 2018-06-26 VITALS — Ht 67.5 in | Wt 118.0 lb

## 2018-06-26 DIAGNOSIS — Z800 Family history of malignant neoplasm of digestive organs: Secondary | ICD-10-CM

## 2018-06-26 NOTE — Progress Notes (Signed)
No egg or soy allergy known to patient  No issues with past sedation with any surgeries  or procedures, no intubation problems  No diet pills per patient No home 02 use per patient  No blood thinners per patient  Pt denies issues with constipation  No A fib or A flutter  EMMI video sent to pt's e mail  

## 2018-07-10 ENCOUNTER — Encounter: Payer: Self-pay | Admitting: Gastroenterology

## 2018-07-10 ENCOUNTER — Ambulatory Visit (AMBULATORY_SURGERY_CENTER): Payer: Medicare Other | Admitting: Gastroenterology

## 2018-07-10 VITALS — BP 116/62 | HR 77 | Temp 98.6°F | Resp 15 | Ht 68.4 in | Wt 118.0 lb

## 2018-07-10 DIAGNOSIS — Z800 Family history of malignant neoplasm of digestive organs: Secondary | ICD-10-CM | POA: Diagnosis not present

## 2018-07-10 DIAGNOSIS — Z1211 Encounter for screening for malignant neoplasm of colon: Secondary | ICD-10-CM

## 2018-07-10 DIAGNOSIS — D120 Benign neoplasm of cecum: Secondary | ICD-10-CM

## 2018-07-10 MED ORDER — SODIUM CHLORIDE 0.9 % IV SOLN: 500.0000 mL | Freq: Once | INTRAVENOUS | Status: DC

## 2018-07-10 NOTE — Progress Notes (Signed)
A/ox3 pleased with MAC, report to RN 

## 2018-07-10 NOTE — Progress Notes (Signed)
Called to room to assist during endoscopic procedure.  Patient ID and intended procedure confirmed with present staff. Received instructions for my participation in the procedure from the performing physician.  

## 2018-07-10 NOTE — Patient Instructions (Signed)
  Handouts given: polyps, diverticulosis and hemorrhoids.   YOU HAD AN ENDOSCOPIC PROCEDURE TODAY AT [facility]:   Refer to the procedure report that was given to you for any specific questions about what was found during the examination.  If the procedure report does not answer your questions, please call your gastroenterologist to clarify.  If you requested that your care partner not be given the details of your procedure findings, then the procedure report has been included in a sealed envelope for you to review at your convenience later.  YOU SHOULD EXPECT: Some feelings of bloating in the abdomen. Passage of more gas than usual.  Walking can help get rid of the air that was put into your GI tract during the procedure and reduce the bloating. If you had a lower endoscopy (such as a colonoscopy or flexible sigmoidoscopy) you may notice spotting of blood in your stool or on the toilet paper. If you underwent a bowel prep for your procedure, you may not have a normal bowel movement for a few days.  Please Note:  You might notice some irritation and congestion in your nose or some drainage.  This is from the oxygen used during your procedure.  There is no need for concern and it should clear up in a day or so.  SYMPTOMS TO REPORT IMMEDIATELY:   Following lower endoscopy (colonoscopy or flexible sigmoidoscopy):  Excessive amounts of blood in the stool  Significant tenderness or worsening of abdominal pains  Swelling of the abdomen that is new, acute  Fever of 100F or higher   For urgent or emergent issues, a gastroenterologist can be reached at any hour by calling [phone].   DIET:  We do recommend a small meal at first, but then you may proceed to your regular diet.  Drink plenty of fluids but you should avoid alcoholic beverages for 24 hours.  ACTIVITY:  You should plan to take it easy for the rest of today and you should NOT DRIVE or use heavy machinery until  tomorrow (because of the sedation medicines used during the test).    FOLLOW UP: Our staff will call the number listed on your records the next business day following your procedure to check on you and address any questions or concerns that you may have regarding the information given to you following your procedure. If we do not reach you, we will leave a message.  However, if you are feeling well and you are not experiencing any problems, there is no need to return our call.  We will assume that you have returned to your regular daily activities without incident.  If any biopsies were taken you will be contacted by phone or by letter within the next 1-3 weeks.  Please call us at [phone] if you have not heard about the biopsies in 3 weeks.    SIGNATURES/CONFIDENTIALITY: You and/or your care partner have signed paperwork which will be entered into your electronic medical record.  These signatures attest to the fact that that the information above on your After Visit Summary has been reviewed and is understood.  Full responsibility of the confidentiality of this discharge information lies with you and/or your care-partner.

## 2018-07-10 NOTE — Op Note (Signed)
[place] Patient Name: Sheryl Huang Procedure Date: 07/10/2018 9:25 AM MRN: [id number] Endoscopist: Mauri Pole , MD Age: 72 Referring MD:  Date of Birth: 07-31-1946 Gender: Female Account #: [id number] Procedure:                Colonoscopy Indications:              Screening in patient at increased risk: Family                            history of 1st-degree relative with colorectal                            cancer Medicines:                Monitored Anesthesia Care Procedure:                Pre-Anesthesia Assessment:                           - Prior to the procedure, a History and Physical                            was performed, and patient medications and                            allergies were reviewed. The patient's tolerance of                            previous anesthesia was also reviewed. The risks                            and benefits of the procedure and the sedation                            options and risks were discussed with the patient.                            All questions were answered, and informed consent                            was obtained. Prior Anticoagulants: The patient has                            taken no previous anticoagulant or antiplatelet                            agents. ASA Grade Assessment: II - A patient with                            mild systemic disease. After reviewing the risks                            and benefits, the patient was deemed in  satisfactory condition to undergo the procedure.                           After obtaining informed consent, the colonoscope                            was passed under direct vision. Throughout the                            procedure, the patient's blood pressure, pulse, and                            oxygen saturations were monitored continuously. The                            Model PCF-[id number]) scope was introduced                          through the anus and advanced to the the cecum,                            identified by appendiceal orifice and ileocecal                            valve. The colonoscopy was performed without                            difficulty. The patient tolerated the procedure                            well. The quality of the bowel preparation was                            adequate. The ileocecal valve, appendiceal orifice,                            and rectum were photographed. Scope In: 9:36:38 AM Scope Out: 9:56:52 AM Scope Withdrawal Time: 0 hours 14 minutes 53 seconds  Total Procedure Duration: 0 hours 20 minutes 14 seconds  Findings:                 The perianal and digital rectal examinations were                            normal.                           A 7 mm polyp was found in the cecum. The polyp was                            sessile. The polyp was removed with a cold snare.                            Resection and retrieval were complete.  Multiple small-mouthed diverticula were found in                            the sigmoid colon.                           Non-bleeding internal hemorrhoids were found during                            retroflexion. The hemorrhoids were small.                           A single medium-sized localized angiodysplastic                            lesion without bleeding was found in the sigmoid                            colon. Complications:            No immediate complications. Estimated Blood Loss:     Estimated blood loss was minimal. Impression:               - One 7 mm polyp in the cecum, removed with a cold                            snare. Resected and retrieved.                           - Diverticulosis in the sigmoid colon.                           - Non-bleeding internal hemorrhoids.                           - A single non-bleeding colonic angiodysplastic                             lesion. Recommendation:           - Patient has a contact number available for                            emergencies. The signs and symptoms of potential                            delayed complications were discussed with the                            patient. Return to normal activities tomorrow.                            Written discharge instructions were provided to the                            patient.                           -  Resume previous diet.                           - Continue present medications.                           - Await pathology results.                           - Repeat colonoscopy in 5 years for surveillance                            based on pathology results. Mauri Pole, MD 07/10/2018 10:00:34 AM This report has been signed electronically.

## 2018-07-11 ENCOUNTER — Telehealth: Payer: Self-pay | Admitting: *Deleted

## 2018-07-11 NOTE — Telephone Encounter (Signed)
  Follow up Call-  Call back number 07/10/2018  Post procedure Call Back phone  # [phone]  Permission to leave phone message Yes  Some recent data might be hidden     Patient questions:  Do you have a fever, pain , or abdominal swelling? No. Pain Score  0 *  Have you tolerated food without any problems? Yes.    Have you been able to return to your normal activities? Yes.    Do you have any questions about your discharge instructions: Diet   No. Medications  No. Follow up visit  No.  Do you have questions or concerns about your Care? No.  Actions: * If pain score is 4 or above: No action needed, pain <4.

## 2018-07-15 ENCOUNTER — Encounter: Payer: Self-pay | Admitting: Gastroenterology

## 2018-07-18 ENCOUNTER — Telehealth: Payer: Self-pay | Admitting: *Deleted

## 2018-07-18 DIAGNOSIS — H40013 Open angle with borderline findings, low risk, bilateral: Secondary | ICD-10-CM | POA: Diagnosis not present

## 2018-07-18 DIAGNOSIS — H1851 Endothelial corneal dystrophy: Secondary | ICD-10-CM | POA: Diagnosis not present

## 2018-07-18 DIAGNOSIS — Z961 Presence of intraocular lens: Secondary | ICD-10-CM | POA: Diagnosis not present

## 2018-07-18 NOTE — Telephone Encounter (Addendum)
Deductible Amounts $185($145mt)   OOP MAX Amount Met  Annual exam  01/10/18 TF  Calcium 9.3             Date 09/14/17  Upcoming dental procedures NO  Prior Authorization needed NO  Pt estimated Cost $0   APPT  08/12/18 @9 :00   Coverage Details:: This is a Medicare Supplement Plan F and it covers the Medicare Part B deductible, co-insurance and 100% of the excess charges   SUMMARY OF BENEFITS SCANNED IN [place]

## 2018-07-27 DIAGNOSIS — Z23 Encounter for immunization: Secondary | ICD-10-CM | POA: Diagnosis not present

## 2018-08-12 ENCOUNTER — Ambulatory Visit (INDEPENDENT_AMBULATORY_CARE_PROVIDER_SITE_OTHER): Payer: Medicare Other | Admitting: Gynecology

## 2018-08-12 DIAGNOSIS — M810 Age-related osteoporosis without current pathological fracture: Secondary | ICD-10-CM | POA: Diagnosis not present

## 2018-08-12 MED ORDER — DENOSUMAB 60 MG/ML ~~LOC~~ SOSY
60.0000 mg | PREFILLED_SYRINGE | Freq: Once | SUBCUTANEOUS | Status: AC
  Administered 2018-08-12: 60 mg via SUBCUTANEOUS

## 2018-08-14 NOTE — Telephone Encounter (Signed)
PROLIA GIVEN  08/12/18 NEXT INJECTION 02/12/2019

## 2018-11-12 ENCOUNTER — Encounter: Payer: Self-pay | Admitting: Gynecology

## 2019-01-13 ENCOUNTER — Other Ambulatory Visit: Payer: Self-pay

## 2019-01-13 ENCOUNTER — Encounter: Payer: Self-pay | Admitting: Gynecology

## 2019-01-13 ENCOUNTER — Ambulatory Visit (INDEPENDENT_AMBULATORY_CARE_PROVIDER_SITE_OTHER): Payer: Medicare Other | Admitting: Gynecology

## 2019-01-13 VITALS — BP 116/74 | Ht 65.0 in | Wt 113.0 lb

## 2019-01-13 DIAGNOSIS — M810 Age-related osteoporosis without current pathological fracture: Secondary | ICD-10-CM

## 2019-01-13 DIAGNOSIS — Z01419 Encounter for gynecological examination (general) (routine) without abnormal findings: Secondary | ICD-10-CM

## 2019-01-13 DIAGNOSIS — N952 Postmenopausal atrophic vaginitis: Secondary | ICD-10-CM

## 2019-01-13 NOTE — Patient Instructions (Signed)
Continue with your Prolia shots every 6 months.  Follow-up for the bone density as scheduled.

## 2019-01-13 NOTE — Progress Notes (Signed)
    Sheryl Huang 05-03-1946 [id number]        73 y.o.  G1P1001 for breast and pelvic exam.  Without gynecologic complaints  Past medical history,surgical history, problem list, medications, allergies, family history and social history were all reviewed and documented as reviewed in the EPIC chart.  ROS:  Performed with pertinent positives and negatives included in the history, assessment and plan.   Additional significant findings : None   Exam: Sheryl Huang assistant Vitals:   01/13/19 0810  BP: 116/74  Weight: 113 lb (51.3 kg)  Height: 5\' 5"  (1.651 m)   Body mass index is 18.8 kg/m.  General appearance:  Normal affect, orientation and appearance. Skin: Grossly normal HEENT: Without gross lesions.  No cervical or supraclavicular adenopathy. Thyroid normal.  Lungs:  Clear without wheezing, rales or rhonchi Cardiac: RR, without RMG Abdominal:  Soft, nontender, without masses, guarding, rebound, organomegaly or hernia Breasts:  Examined lying and sitting without masses, retractions, discharge or axillary adenopathy. Pelvic:  Ext, BUS, Vagina: With atrophic changes  Cervix: With atrophic changes  Uterus: Anteverted, normal size, shape and contour, midline and mobile nontender   Adnexa: Without masses or tenderness    Anus and perineum: Normal   Rectovaginal: Normal sphincter tone without palpated masses or tenderness.    Assessment/Plan:  73 y.o. G63P1001 female for breast and pelvic exam  1. Postmenopausal.  No significant menopausal symptoms or any vaginal bleeding. 2. Osteoporosis.  DEXA 2018 T score -3.8.  Starting third year of Prolia.  Doing well with this.  Schedule DEXA now at 2-year interval.  Continue on Prolia. 3. Pap smear 2018.  No Pap smear done today.  No history of abnormal Pap smears.  Options to stop screening per current screening guidelines reviewed. 4. Colonoscopy 2019.  Repeat at their recommended interval. 5. Mammography 01/2018.  Continue with annual  mammography when due.  Breast exam normal today. 6. Health maintenance.  No routine lab work done as patient does this elsewhere.  Follow-up 1 year, sooner as needed.   Sheryl Auerbach MD, 8:40 AM 01/13/2019

## 2019-01-14 ENCOUNTER — Telehealth: Payer: Self-pay | Admitting: *Deleted

## 2019-01-14 DIAGNOSIS — M818 Other osteoporosis without current pathological fracture: Secondary | ICD-10-CM

## 2019-01-14 NOTE — Telephone Encounter (Signed)
Annual exam 01/13/2019 TF  Calcium             Date  Upcoming dental procedures   Prior Authorization needed NO  Pt estimated Cost $0       Coverage Details: $0

## 2019-02-06 ENCOUNTER — Telehealth: Payer: Self-pay | Admitting: *Deleted

## 2019-02-06 NOTE — Telephone Encounter (Signed)
Patient informed. 

## 2019-02-06 NOTE — Telephone Encounter (Signed)
Patient called her dexa was canceled due to COVID-19 for this month she is due for Prolia injection now, would you prefer patient have dexa first or okay to proceed with Prolia injection? Please advise

## 2019-02-06 NOTE — Telephone Encounter (Signed)
Okay for Prolia.

## 2019-02-06 NOTE — Telephone Encounter (Signed)
Patient scheduled for calcium on 02/10/19 @10 :00am,order placed, I told her once lab result returns and normal we call to schedule Prolia injection. Aware $0 cost for injection.

## 2019-02-10 ENCOUNTER — Other Ambulatory Visit (INDEPENDENT_AMBULATORY_CARE_PROVIDER_SITE_OTHER): Payer: Medicare Other

## 2019-02-10 ENCOUNTER — Other Ambulatory Visit: Payer: Self-pay

## 2019-02-10 DIAGNOSIS — M818 Other osteoporosis without current pathological fracture: Secondary | ICD-10-CM | POA: Diagnosis not present

## 2019-02-10 LAB — CALCIUM: Calcium: 9.3 mg/dL (ref 8.6–10.4)

## 2019-02-11 NOTE — Telephone Encounter (Signed)
Pt informed Calcium Nml from 02/10/19.  Apt made for 4/15 at 9am for Prolia injection. KW CMA

## 2019-02-12 ENCOUNTER — Other Ambulatory Visit: Payer: Self-pay

## 2019-02-12 ENCOUNTER — Ambulatory Visit (INDEPENDENT_AMBULATORY_CARE_PROVIDER_SITE_OTHER): Payer: Medicare Other | Admitting: Gynecology

## 2019-02-12 ENCOUNTER — Encounter: Payer: Self-pay | Admitting: Gynecology

## 2019-02-12 DIAGNOSIS — M810 Age-related osteoporosis without current pathological fracture: Secondary | ICD-10-CM

## 2019-02-12 MED ORDER — DENOSUMAB 60 MG/ML ~~LOC~~ SOSY
60.0000 mg | PREFILLED_SYRINGE | Freq: Once | SUBCUTANEOUS | Status: AC
  Administered 2019-02-12: 13:00:00 60 mg via SUBCUTANEOUS

## 2019-02-12 NOTE — Telephone Encounter (Signed)
Injection given 02/12/19 KW CMA

## 2019-03-19 ENCOUNTER — Encounter: Payer: Self-pay | Admitting: Gynecology

## 2019-04-29 ENCOUNTER — Encounter: Payer: Self-pay | Admitting: Gynecology

## 2019-04-29 DIAGNOSIS — Z1231 Encounter for screening mammogram for malignant neoplasm of breast: Secondary | ICD-10-CM | POA: Diagnosis not present

## 2019-06-12 NOTE — Telephone Encounter (Signed)
Next injection 08/15/2019

## 2019-08-04 ENCOUNTER — Telehealth: Payer: Self-pay | Admitting: *Deleted

## 2019-08-04 NOTE — Telephone Encounter (Addendum)
Deductible $198 ($156met)    Annual exam 01/13/2019 TF  Calcium 9.3             Date 02/10/2019  Upcoming dental procedures   Prior Authorization needed NO  Pt estimated Cost $0    APPT 08/20/2019 @ 9:30   Coverage Details:$0 one dose,$0 admin fee

## 2019-08-06 ENCOUNTER — Other Ambulatory Visit: Payer: Self-pay

## 2019-08-06 ENCOUNTER — Encounter: Payer: Self-pay | Admitting: Gynecology

## 2019-08-06 ENCOUNTER — Ambulatory Visit (INDEPENDENT_AMBULATORY_CARE_PROVIDER_SITE_OTHER): Payer: Medicare Other

## 2019-08-06 DIAGNOSIS — Z23 Encounter for immunization: Secondary | ICD-10-CM

## 2019-08-15 ENCOUNTER — Ambulatory Visit: Payer: Medicare Other

## 2019-08-20 ENCOUNTER — Ambulatory Visit (INDEPENDENT_AMBULATORY_CARE_PROVIDER_SITE_OTHER): Payer: Medicare Other | Admitting: Gynecology

## 2019-08-20 ENCOUNTER — Other Ambulatory Visit: Payer: Self-pay

## 2019-08-20 DIAGNOSIS — M810 Age-related osteoporosis without current pathological fracture: Secondary | ICD-10-CM

## 2019-08-20 MED ORDER — DENOSUMAB 60 MG/ML ~~LOC~~ SOSY
60.0000 mg | PREFILLED_SYRINGE | Freq: Once | SUBCUTANEOUS | Status: AC
Start: 2019-08-20 — End: 2019-08-20
  Administered 2019-08-20: 60 mg via SUBCUTANEOUS

## 2019-10-16 ENCOUNTER — Encounter: Payer: Self-pay | Admitting: Gynecology

## 2019-11-05 ENCOUNTER — Ambulatory Visit: Payer: Medicare Other | Attending: Internal Medicine

## 2019-11-05 DIAGNOSIS — Z20822 Contact with and (suspected) exposure to covid-19: Secondary | ICD-10-CM | POA: Diagnosis not present

## 2019-11-06 LAB — NOVEL CORONAVIRUS, NAA: SARS-CoV-2, NAA: DETECTED — AB

## 2019-11-07 ENCOUNTER — Other Ambulatory Visit: Payer: Self-pay | Admitting: Nurse Practitioner

## 2019-11-07 DIAGNOSIS — U071 COVID-19: Secondary | ICD-10-CM

## 2019-11-07 NOTE — Progress Notes (Signed)
  I connected by phone with Sheryl Huang on 11/07/2019 at 9:52 AM to discuss the potential use of an new treatment for mild to moderate COVID-19 viral infection in non-hospitalized patients.  This patient is a 74 y.o. female that meets the FDA criteria for Emergency Use Authorization of bamlanivimab or casirivimab\imdevimab.  Has a (+) direct SARS-CoV-2 viral test result  Has mild or moderate COVID-19   Is ? 73 years of age and weighs ? 40 kg  Is NOT hospitalized due to COVID-19  Is NOT requiring oxygen therapy or requiring an increase in baseline oxygen flow rate due to COVID-19  Is within 10 days of symptom onset  Has at least one of the high risk factor(s) for progression to severe COVID-19 and/or hospitalization as defined in EUA.  Specific high risk criteria : >/= 73 yo   I have spoken and communicated the following to the patient or parent/caregiver:  1. FDA has authorized the emergency use of bamlanivimab and casirivimab\imdevimab for the treatment of mild to moderate COVID-19 in adults and pediatric patients with positive results of direct SARS-CoV-2 viral testing who are 17 years of age and older weighing at least 40 kg, and who are at high risk for progressing to severe COVID-19 and/or hospitalization.  2. The significant known and potential risks and benefits of bamlanivimab and casirivimab\imdevimab, and the extent to which such potential risks and benefits are unknown.  3. Information on available alternative treatments and the risks and benefits of those alternatives, including clinical trials.  4. Patients treated with bamlanivimab and casirivimab\imdevimab should continue to self-isolate and use infection control measures (e.g., wear mask, isolate, social distance, avoid sharing personal items, clean and disinfect "high touch" surfaces, and frequent handwashing) according to CDC guidelines.   5. The patient or parent/caregiver has the option to accept or refuse  bamlanivimab or casirivimab\imdevimab .  After reviewing this information with the patient, The patient agreed to proceed with receiving the bamlanimivab infusion and will be provided a copy of the Fact sheet prior to receiving the infusion.Fenton Foy 11/07/2019 9:52 AM

## 2019-11-10 ENCOUNTER — Ambulatory Visit: Payer: Self-pay | Admitting: *Deleted

## 2019-11-10 NOTE — Telephone Encounter (Signed)
She called in and a message was left in the nurse triage queue to call her back.   She had questions pertaining to an infusion she is scheduled for tomorrow at the [facility] at the [facility]. I sent a message to Lazaro Arms, NP who handles these calls.   She is going to call the pt back and answer her questions.

## 2019-11-11 ENCOUNTER — Ambulatory Visit (HOSPITAL_COMMUNITY)
Admission: RE | Admit: 2019-11-11 | Discharge: 2019-11-11 | Disposition: A | Discharge status: Home / Self Care | Payer: Medicare Other | Source: Ambulatory Visit | Attending: Pulmonary Disease | Admitting: Pulmonary Disease

## 2019-11-11 DIAGNOSIS — U071 COVID-19: Secondary | ICD-10-CM | POA: Diagnosis not present

## 2019-11-11 DIAGNOSIS — Z23 Encounter for immunization: Secondary | ICD-10-CM | POA: Insufficient documentation

## 2019-11-11 MED ORDER — SODIUM CHLORIDE 0.9 % IV SOLN
INTRAVENOUS | Status: DC | PRN
  Administered 2019-11-11: 250 mL via INTRAVENOUS

## 2019-11-11 MED ORDER — DIPHENHYDRAMINE HCL 50 MG/ML IJ SOLN: 50.0000 mg | Freq: Once | INTRAMUSCULAR | Status: DC | PRN

## 2019-11-11 MED ORDER — EPINEPHRINE 0.3 MG/0.3ML IJ SOAJ: 0.3000 mg | Freq: Once | INTRAMUSCULAR | Status: DC | PRN

## 2019-11-11 MED ORDER — METHYLPREDNISOLONE SODIUM SUCC 125 MG IJ SOLR: 125.0000 mg | Freq: Once | INTRAMUSCULAR | Status: DC | PRN

## 2019-11-11 MED ORDER — SODIUM CHLORIDE 0.9 % IV SOLN
700.0000 mg | Freq: Once | INTRAVENOUS | Status: AC
  Administered 2019-11-11: 700 mg via INTRAVENOUS
  Filled 2019-11-11: qty 20

## 2019-11-11 MED ORDER — ALBUTEROL SULFATE HFA 108 (90 BASE) MCG/ACT IN AERS: 2.0000 | INHALATION_SPRAY | Freq: Once | RESPIRATORY_TRACT | Status: DC | PRN

## 2019-11-11 MED ORDER — FAMOTIDINE IN NACL 20-0.9 MG/50ML-% IV SOLN: 20.0000 mg | Freq: Once | INTRAVENOUS | Status: DC | PRN

## 2019-11-11 NOTE — Discharge Instructions (Signed)

## 2019-11-11 NOTE — Progress Notes (Signed)
  Diagnosis: COVID-19  Physician:  Procedure: [facility] Med: remdesivir infusion.  Complications: No immediate complications noted.  Discharge: Discharged home   Sheryl Huang 11/11/2019

## 2019-11-17 NOTE — Telephone Encounter (Signed)
PROLIA GIVEN 08/20/2019 NEXT INJECTION 02/19/2020

## 2020-01-08 DIAGNOSIS — B86 Scabies: Secondary | ICD-10-CM | POA: Diagnosis not present

## 2020-02-02 ENCOUNTER — Telehealth: Payer: Self-pay | Admitting: *Deleted

## 2020-02-02 DIAGNOSIS — M810 Age-related osteoporosis without current pathological fracture: Secondary | ICD-10-CM

## 2020-02-02 NOTE — Telephone Encounter (Addendum)
Deductible [id number])  OOP MAX undisclosed  Annual exam 02/11/2020 TW  Calcium 9.6            Date 02/11/2020  Upcoming dental procedures   Prior Authorization needed NO  Pt estimated Cost $0   APPT SAME DAY AS BONE DENSITY 02/24/2020    Coverage Details: This is a [employer] F Plan and it covers the medicare Part B deductible and 100% of the excess charges $0 ONE DOSE, 0% ADMIN FEE

## 2020-02-10 ENCOUNTER — Other Ambulatory Visit: Payer: Self-pay

## 2020-02-11 ENCOUNTER — Ambulatory Visit (INDEPENDENT_AMBULATORY_CARE_PROVIDER_SITE_OTHER): Payer: Medicare Other | Admitting: Nurse Practitioner

## 2020-02-11 ENCOUNTER — Encounter: Payer: Self-pay | Admitting: Nurse Practitioner

## 2020-02-11 VITALS — BP 122/78 | Ht 65.0 in | Wt 108.0 lb

## 2020-02-11 DIAGNOSIS — E7800 Pure hypercholesterolemia, unspecified: Secondary | ICD-10-CM | POA: Diagnosis not present

## 2020-02-11 DIAGNOSIS — M810 Age-related osteoporosis without current pathological fracture: Secondary | ICD-10-CM | POA: Diagnosis not present

## 2020-02-11 DIAGNOSIS — Z780 Asymptomatic menopausal state: Secondary | ICD-10-CM | POA: Diagnosis not present

## 2020-02-11 DIAGNOSIS — Z13220 Encounter for screening for lipoid disorders: Secondary | ICD-10-CM

## 2020-02-11 DIAGNOSIS — Z01419 Encounter for gynecological examination (general) (routine) without abnormal findings: Secondary | ICD-10-CM

## 2020-02-11 HISTORY — DX: Asymptomatic menopausal state: Z78.0

## 2020-02-11 NOTE — Progress Notes (Signed)
AM…Sheryl Huang November 27, 1945 [id number]    History:    74 year old Sheryl Huang presents for breast and pelvic exam without gynecological complaints.  DEXA in 2018 showed T score of -3.8, taking calcium, vitamin D, and Prolia. Unable to have DEXA scan last year due to pandemic. Mammogram 03/2019 normal. Pap 2018 normal. Husband passed in January.  Plans to get back to walking.  Has PCP but has not seen in a few years.   Past medical history, past surgical history, family history and social history were all reviewed and documented in the EPIC chart.  ROS:  A ROS was performed and pertinent positives and negatives are included.  Exam:  Vitals:   02/11/20 0928  BP: 122/78  Weight: 108 lb (49 kg)  Height: 5\' 5"  (1.651 m)   Body mass index is 17.97 kg/m.   General appearance:  Normal Thyroid:  Symmetrical, normal in size, without palpable masses or nodularity. Respiratory  Auscultation:  Clear without wheezing or rhonchi Cardiovascular  Auscultation:  Regular rate, without rubs, murmurs or gallops  Edema/varicosities:  Not grossly evident MSK  Scoliois Abdominal  Soft,nontender, without masses, guarding or rebound.  Liver/spleen:  No organomegaly noted  Hernia:  None appreciated  Skin  Inspection:  Grossly normal   Breasts: Examined lying and sitting.     Right: Small mass at 12 o'clock, nontender, immobile, solid. No  Retractions, discharge or axillary adenopathy.     Left: Without masses, retractions, discharge or axillary adenopathy. Gentitourinary   Inguinal/mons:  Normal without inguinal adenopathy  External genitalia:  Normal  BUS/Urethra/Skene's glands:  Normal  Vagina:  Normal, atrophic changes  Cervix:  Normal, atrophic changes  Uterus:  Anteverted, normal in size, shape and contour.  Midline and mobile  Adnexa/parametria:     Rt: Without masses or tenderness.   Lt: Without masses or tenderness.  Anus and perineum: Normal  Digital rectal exam: Normal sphincter tone  without palpated masses or tenderness  Assessmen/Plan:  74 y.o. for breast and pelvic exam  1. Postmenopausal: No significant menopausal symptoms or vaginal bleeding 2. Osteoporosis: DEXA 2018 T score -3.8.  Starting fourth year of Prolia, doing well with this.  Taking calcium and vitamin D.  Instructed to schedule DEXA scan soon. 3.  Pap smear 2018 normal.  No Pap smear done today.  No history of abnormal Pap smears, reviewed the options to stop screening per current guidelines.  Patient would like to continue. 4. Colonoscopy 2019.  Repeat colonoscopy 2024 per recommendation 5.  Mammogram June 2020 normal.  Plans to schedule annual mammogram soon. Small mass on right breast at 12 o'clock. 6.  Health maintenance. Labs today: CBC, CMP, lipid panel, calcium. Follow-up in 1 year for annual or sooner if needed.     [name] [facility], 9:49 AM 02/11/2020

## 2020-02-11 NOTE — Patient Instructions (Addendum)
Nice to meet you today! Continue Calcium and Vitamin Weightbearing exercises  Denosumab injection What is this medicine? DENOSUMAB (den oh sue mab) slows bone breakdown. Prolia is used to treat osteoporosis in women after menopause and in men, and in people who are taking corticosteroids for 6 months or more. Delton See is used to treat a high calcium level due to cancer and to prevent bone fractures and other bone problems caused by multiple myeloma or cancer bone metastases. Delton See is also used to treat giant cell tumor of the bone. This medicine may be used for other purposes; ask your health care provider or pharmacist if you have questions. COMMON BRAND NAME(S): Prolia, XGEVA What should I tell my health care provider before I take this medicine? They need to know if you have any of these conditions:  dental disease  having surgery or tooth extraction  infection  kidney disease  low levels of calcium or Vitamin D in the blood  malnutrition  on hemodialysis  skin conditions or sensitivity  thyroid or parathyroid disease  an unusual reaction to denosumab, other medicines, foods, dyes, or preservatives  pregnant or trying to get pregnant  breast-feeding How should I use this medicine? This medicine is for injection under the skin. It is given by a health care professional in a hospital or clinic setting. A special MedGuide will be given to you before each treatment. Be sure to read this information carefully each time. For Prolia, talk to your pediatrician regarding the use of this medicine in children. Special care may be needed. For Delton See, talk to your pediatrician regarding the use of this medicine in children. While this drug may be prescribed for children as young as 13 years for selected conditions, precautions do apply. Overdosage: If you think you have taken too much of this medicine contact a poison control center or emergency room at once. NOTE: This medicine is only for  you. Do not share this medicine with others. What if I miss a dose? It is important not to miss your dose. Call your doctor or health care professional if you are unable to keep an appointment. What may interact with this medicine? Do not take this medicine with any of the following medications:  other medicines containing denosumab This medicine may also interact with the following medications:  medicines that lower your chance of fighting infection  steroid medicines like prednisone or cortisone This list may not describe all possible interactions. Give your health care provider a list of all the medicines, herbs, non-prescription drugs, or dietary supplements you use. Also tell them if you smoke, drink alcohol, or use illegal drugs. Some items may interact with your medicine. What should I watch for while using this medicine? Visit your doctor or health care professional for regular checks on your progress. Your doctor or health care professional may order blood tests and other tests to see how you are doing. Call your doctor or health care professional for advice if you get a fever, chills or sore throat, or other symptoms of a cold or flu. Do not treat yourself. This drug may decrease your body's ability to fight infection. Try to avoid being around people who are sick. You should make sure you get enough calcium and vitamin D while you are taking this medicine, unless your doctor tells you not to. Discuss the foods you eat and the vitamins you take with your health care professional. See your dentist regularly. Brush and floss your teeth as directed.  Before you have any dental work done, tell your dentist you are receiving this medicine. Do not become pregnant while taking this medicine or for 5 months after stopping it. Talk with your doctor or health care professional about your birth control options while taking this medicine. Women should inform their doctor if they wish to become  pregnant or think they might be pregnant. There is a potential for serious side effects to an unborn child. Talk to your health care professional or pharmacist for more information. What side effects may I notice from receiving this medicine? Side effects that you should report to your doctor or health care professional as soon as possible:  allergic reactions like skin rash, itching or hives, swelling of the face, lips, or tongue  bone pain  breathing problems  dizziness  jaw pain, especially after dental work  redness, blistering, peeling of the skin  signs and symptoms of infection like fever or chills; cough; sore throat; pain or trouble passing urine  signs of low calcium like fast heartbeat, muscle cramps or muscle pain; pain, tingling, numbness in the hands or feet; seizures  unusual bleeding or bruising  unusually weak or tired Side effects that usually do not require medical attention (report to your doctor or health care professional if they continue or are bothersome):  constipation  diarrhea  headache  joint pain  loss of appetite  muscle pain  runny nose  tiredness  upset stomach This list may not describe all possible side effects. Call your doctor for medical advice about side effects. You may report side effects to FDA at 1-800-FDA-1088. Where should I keep my medicine? This medicine is only given in a clinic, doctor's office, or other health care setting and will not be stored at home. NOTE: This sheet is a summary. It may not cover all possible information. If you have questions about this medicine, talk to your doctor, pharmacist, or health care provider.  2020 Elsevier/Gold Standard (2018-02-22 16:10:44) Osteoporosis  Osteoporosis happens when your bones get thin and weak. This can cause your bones to break (fracture) more easily. You can do things at home to make your bones stronger. Follow these instructions at home:  Activity  Exercise as  told by your doctor. Ask your doctor what activities are safe for you. You should do: ? Exercises that make your muscles work to hold your body weight up (weight-bearing exercises). These include tai chi, yoga, and walking. ? Exercises to make your muscles stronger. One example is lifting weights. Lifestyle  Limit alcohol intake to no more than 1 drink a day for nonpregnant women and 2 drinks a day for men. One drink equals 12 oz of beer, 5 oz of wine, or 1 oz of hard liquor.  Do not use any products that have nicotine or tobacco in them. These include cigarettes and e-cigarettes. If you need help quitting, ask your doctor. Preventing falls  Use tools to help you move around (mobility aids) as needed. These include canes, walkers, scooters, and crutches.  Keep rooms well-lit and free of clutter.  Put away things that could make you trip. These include cords and rugs.  Install safety rails on stairs. Install grab bars in bathrooms.  Use rubber mats in slippery areas, like bathrooms.  Wear shoes that: ? Fit you well. ? Support your feet. ? Have closed toes. ? Have rubber soles or low heels.  Tell your doctor about all of the medicines you are taking. Some medicines can  make you more likely to fall. General instructions  Eat plenty of calcium and vitamin D. These nutrients are good for your bones. Good sources of calcium and vitamin D include: ? Some fatty fish, such as salmon and tuna. ? Foods that have calcium and vitamin D added to them (fortified foods). For example, some breakfast cereals are fortified with calcium and vitamin D. ? Egg yolks. ? Cheese. ? Liver.  Take over-the-counter and prescription medicines only as told by your doctor.  Keep all follow-up visits as told by your doctor. This is important. Contact a doctor if:  You have not been tested (screened) for osteoporosis and you are: ? A woman who is age 67 or older. ? A man who is age 62 or older. Get help  right away if:  You fall.  You get hurt. Summary  Osteoporosis happens when your bones get thin and weak.  Weak bones can break (fracture) more easily.  Eat plenty of calcium and vitamin D. These nutrients are good for your bones.  Tell your doctor about all of the medicines that you take. This information is not intended to replace advice given to you by your health care provider. Make sure you discuss any questions you have with your health care provider. Document Revised: 09/28/2017 Document Reviewed: 08/10/2017 Elsevier Patient Education  2020 Declo Maintenance After Age 51 After age 47, you are at a higher risk for certain long-term diseases and infections as well as injuries from falls. Falls are a major cause of broken bones and head injuries in people who are older than age 30. Getting regular preventive care can help to keep you healthy and well. Preventive care includes getting regular testing and making lifestyle changes as recommended by your health care provider. Talk with your health care provider about:  Which screenings and tests you should have. A screening is a test that checks for a disease when you have no symptoms.  A diet and exercise plan that is right for you. What should I know about screenings and tests to prevent falls? Screening and testing are the best ways to find a health problem early. Early diagnosis and treatment give you the best chance of managing medical conditions that are common after age 31. Certain conditions and lifestyle choices may make you more likely to have a fall. Your health care provider may recommend:  Regular vision checks. Poor vision and conditions such as cataracts can make you more likely to have a fall. If you wear glasses, make sure to get your prescription updated if your vision changes.  Medicine review. Work with your health care provider to regularly review all of the medicines you are taking, including  over-the-counter medicines. Ask your health care provider about any side effects that may make you more likely to have a fall. Tell your health care provider if any medicines that you take make you feel dizzy or sleepy.  Osteoporosis screening. Osteoporosis is a condition that causes the bones to get weaker. This can make the bones weak and cause them to break more easily.  Blood pressure screening. Blood pressure changes and medicines to control blood pressure can make you feel dizzy.  Strength and balance checks. Your health care provider may recommend certain tests to check your strength and balance while standing, walking, or changing positions.  Foot health exam. Foot pain and numbness, as well as not wearing proper footwear, can make you more likely to have a fall.  Depression  screening. You may be more likely to have a fall if you have a fear of falling, feel emotionally low, or feel unable to do activities that you used to do.  Alcohol use screening. Using too much alcohol can affect your balance and may make you more likely to have a fall. What actions can I take to lower my risk of falls? General instructions  Talk with your health care provider about your risks for falling. Tell your health care provider if: ? You fall. Be sure to tell your health care provider about all falls, even ones that seem minor. ? You feel dizzy, sleepy, or off-balance.  Take over-the-counter and prescription medicines only as told by your health care provider. These include any supplements.  Eat a healthy diet and maintain a healthy weight. A healthy diet includes low-fat dairy products, low-fat (lean) meats, and fiber from whole grains, beans, and lots of fruits and vegetables. Home safety  Remove any tripping hazards, such as rugs, cords, and clutter.  Install safety equipment such as grab bars in bathrooms and safety rails on stairs.  Keep rooms and walkways well-lit. Activity   Follow a  regular exercise program to stay fit. This will help you maintain your balance. Ask your health care provider what types of exercise are appropriate for you.  If you need a cane or walker, use it as recommended by your health care provider.  Wear supportive shoes that have nonskid soles. Lifestyle  Do not drink alcohol if your health care provider tells you not to drink.  If you drink alcohol, limit how much you have: ? 0-1 drink a day for women. ? 0-2 drinks a day for men.  Be aware of how much alcohol is in your drink. In the [place] one drink equals one typical bottle of beer (12 oz), one-half glass of wine (5 oz), or one shot of hard liquor (1 oz).  Do not use any products that contain nicotine or tobacco, such as cigarettes and e-cigarettes. If you need help quitting, ask your health care provider. Summary  Having a healthy lifestyle and getting preventive care can help to protect your health and wellness after age 6.  Screening and testing are the best way to find a health problem early and help you avoid having a fall. Early diagnosis and treatment give you the best chance for managing medical conditions that are more common for people who are older than age 68.  Falls are a major cause of broken bones and head injuries in people who are older than age 68. Take precautions to prevent a fall at home.  Work with your health care provider to learn what changes you can make to improve your health and wellness and to prevent falls. This information is not intended to replace advice given to you by your health care provider. Make sure you discuss any questions you have with your health care provider. Document Revised: 02/06/2019 Document Reviewed: 08/29/2017 Elsevier Patient Education  2020 [employer].

## 2020-02-12 LAB — COMPREHENSIVE METABOLIC PANEL
AG Ratio: 1.8 (calc) (ref 1.0–2.5)
ALT: 14 U/L (ref 6–29)
AST: 22 U/L (ref 10–35)
Albumin: 4.2 g/dL (ref 3.6–5.1)
Alkaline phosphatase (APISO): 51 U/L (ref 37–153)
BUN: 14 mg/dL (ref 7–25)
CO2: 28 mmol/L (ref 20–32)
Calcium: 9.6 mg/dL (ref 8.6–10.4)
Chloride: 101 mmol/L (ref 98–110)
Creat: 0.78 mg/dL (ref 0.60–0.93)
Globulin: 2.4 g/dL (calc) (ref 1.9–3.7)
Glucose, Bld: 91 mg/dL (ref 65–99)
Potassium: 4 mmol/L (ref 3.5–5.3)
Sodium: 140 mmol/L (ref 135–146)
Total Bilirubin: 0.5 mg/dL (ref 0.2–1.2)
Total Protein: 6.6 g/dL (ref 6.1–8.1)

## 2020-02-12 LAB — CBC WITH DIFFERENTIAL/PLATELET
Absolute Monocytes: 599 cells/uL (ref 200–950)
Basophils Absolute: 38 cells/uL (ref 0–200)
Basophils Relative: 0.6 %
Eosinophils Absolute: 498 cells/uL (ref 15–500)
Eosinophils Relative: 7.9 %
HCT: 39.7 % (ref 35.0–45.0)
Hemoglobin: 13.3 g/dL (ref 11.7–15.5)
Lymphs Abs: 1682 cells/uL (ref 850–3900)
MCH: 31.2 pg (ref 27.0–33.0)
MCHC: 33.5 g/dL (ref 32.0–36.0)
MCV: 93.2 fL (ref 80.0–100.0)
MPV: 11.2 fL (ref 7.5–12.5)
Monocytes Relative: 9.5 %
Neutro Abs: 3484 cells/uL (ref 1500–7800)
Neutrophils Relative %: 55.3 %
Platelets: 239 10*3/uL (ref 140–400)
RBC: 4.26 10*6/uL (ref 3.80–5.10)
RDW: 12.6 % (ref 11.0–15.0)
Total Lymphocyte: 26.7 %
WBC: 6.3 10*3/uL (ref 3.8–10.8)

## 2020-02-12 LAB — URINALYSIS, COMPLETE W/RFL CULTURE
Bacteria, UA: NONE SEEN /HPF
Bilirubin Urine: NEGATIVE
Glucose, UA: NEGATIVE
Hgb urine dipstick: NEGATIVE
Hyaline Cast: NONE SEEN /LPF
Ketones, ur: NEGATIVE
Leukocyte Esterase: NEGATIVE
Nitrites, Initial: NEGATIVE
Protein, ur: NEGATIVE
RBC / HPF: NONE SEEN /HPF (ref 0–2)
Specific Gravity, Urine: 1.018 (ref 1.001–1.03)
Squamous Epithelial / HPF: NONE SEEN /HPF (ref <=5)
WBC, UA: NONE SEEN /HPF (ref 0–5)
pH: 7 (ref 5.0–8.0)

## 2020-02-12 LAB — LIPID PANEL
Cholesterol: 239 mg/dL — ABNORMAL HIGH (ref <200)
HDL: 58 mg/dL (ref >=50)
LDL Cholesterol (Calc): 161 mg/dL (calc) — ABNORMAL HIGH
Non-HDL Cholesterol (Calc): 181 mg/dL (calc) — ABNORMAL HIGH (ref <130)
Total CHOL/HDL Ratio: 4.1 (calc) (ref <5.0)
Triglycerides: 90 mg/dL (ref <150)

## 2020-02-12 LAB — NO CULTURE INDICATED

## 2020-02-23 ENCOUNTER — Other Ambulatory Visit: Payer: Self-pay

## 2020-02-24 ENCOUNTER — Other Ambulatory Visit: Payer: Self-pay | Admitting: Gynecology

## 2020-02-24 ENCOUNTER — Encounter (INDEPENDENT_AMBULATORY_CARE_PROVIDER_SITE_OTHER): Payer: Medicare Other

## 2020-02-24 ENCOUNTER — Telehealth: Payer: Self-pay | Admitting: *Deleted

## 2020-02-24 ENCOUNTER — Ambulatory Visit (INDEPENDENT_AMBULATORY_CARE_PROVIDER_SITE_OTHER): Payer: Medicare Other | Admitting: Gynecology

## 2020-02-24 ENCOUNTER — Other Ambulatory Visit: Payer: Self-pay | Admitting: Obstetrics and Gynecology

## 2020-02-24 DIAGNOSIS — M810 Age-related osteoporosis without current pathological fracture: Secondary | ICD-10-CM

## 2020-02-24 DIAGNOSIS — Z780 Asymptomatic menopausal state: Secondary | ICD-10-CM

## 2020-02-24 MED ORDER — DENOSUMAB 60 MG/ML ~~LOC~~ SOSY
60.0000 mg | PREFILLED_SYRINGE | Freq: Once | SUBCUTANEOUS | Status: AC
  Administered 2020-02-24: 60 mg via SUBCUTANEOUS

## 2020-02-24 NOTE — Telephone Encounter (Signed)
-----   Message from Gorden Harms sent at 02/23/2020  1:12 PM EDT ----- Regarding: dexa order FYI.........Marland Kitchen we need a dexa order on this [facility] patient coming tomorrow/ prolia inj as well  Not sure who will put the order in for [place]  Thanks :)

## 2020-03-16 ENCOUNTER — Ambulatory Visit (INDEPENDENT_AMBULATORY_CARE_PROVIDER_SITE_OTHER): Payer: Medicare Other | Admitting: Family Medicine

## 2020-03-16 ENCOUNTER — Other Ambulatory Visit: Payer: Self-pay

## 2020-03-16 ENCOUNTER — Encounter: Payer: Self-pay | Admitting: Family Medicine

## 2020-03-16 VITALS — BP 122/64 | HR 76 | Temp 97.3°F | Resp 12 | Ht 65.0 in | Wt 110.0 lb

## 2020-03-16 DIAGNOSIS — R1310 Dysphagia, unspecified: Secondary | ICD-10-CM

## 2020-03-16 DIAGNOSIS — F321 Major depressive disorder, single episode, moderate: Secondary | ICD-10-CM | POA: Diagnosis not present

## 2020-03-16 DIAGNOSIS — R1319 Other dysphagia: Secondary | ICD-10-CM

## 2020-03-16 MED ORDER — ESCITALOPRAM OXALATE 10 MG PO TABS: 10.0000 mg | ORAL_TABLET | Freq: Every day | ORAL | 3 refills | Status: DC

## 2020-03-16 MED ORDER — PANTOPRAZOLE SODIUM 40 MG PO TBEC: 40.0000 mg | DELAYED_RELEASE_TABLET | Freq: Every day | ORAL | 3 refills | Status: DC

## 2020-03-16 NOTE — Progress Notes (Signed)
Subjective:    Patient ID: Sheryl Huang, female    DOB: [birth year], 74 y.o.   MRN: [id number]  HPI  Patient is a very pleasant 74 year old Caucasian female here today to discuss dysphagia.  Her husband passed away earlier this year.  She is having a difficult time dealing with his loss.  She does report depression and loneliness and occasional anxiety.  She also reports occasional difficulty sleeping along with anhedonia.  She is interested in taking something to help with her depression.  Symptoms of been present now for several months ever since his passing.  We discussed taking a daily SSRI versus using something like a low-dose Xanax or Ativan just as needed.  Patient feels that her symptoms are bad enough to warrant taking something on an every day basis to try to better manage them.  However her primary reason for making the appointment today was dysphagia.  She states that over the last several weeks she has had a discomfort in her epigastric area.  Occasionally feels like food is getting stuck in the middle of her throat and at the very end of her esophagus.  Primarily its solids such as meat and steak.  She denies any dysphagia to liquids.  She denies any hematemesis.  She denies any nausea or vomiting.  She denies any constipation.  She denies any melena or hematochezia.  She does have a gnawing epigastric discomfort in the center of her abdomen just below the xiphoid process.  She has attributed this to indigestion. Past Medical History:  Diagnosis Date  . Allergy   . Anemia   . Cataract    removed both eyes  . History of shingles   . Osteoporosis 11/2016   T score 3.8  . Postmenopausal 02/11/2020   Past Surgical History:  Procedure Laterality Date  . APPENDECTOMY  1969  . BREAST BIOPSY  2001   left and right  . CATARACT EXTRACTION, BILATERAL    . COLONOSCOPY  2014   neg  . OVARIAN CYST REMOVAL  1995  . TUBAL LIGATION  1992   Current Outpatient Medications on File Prior to  Visit  Medication Sig Dispense Refill  . Calcium Carbonate-Vitamin D (CALCIUM 600 + D PO) Take 1 tablet by mouth 2 (two) times daily.    . cholecalciferol (VITAMIN D) 1000 UNITS tablet Take 1,000 Units by mouth daily.    Marland Kitchen denosumab (PROLIA) 60 MG/ML SOLN injection Inject 60 mg every 6 (six) months into the skin. Administer in upper arm, thigh, or abdomen    . fluticasone (CUTIVATE) 0.05 % cream fluticasone propionate 0.05 % topical cream    . POTASSIUM PO Take 1 tablet by mouth daily.      No current facility-administered medications on file prior to visit.   Allergies  Allergen Reactions  . Erythromycin     REACTION: SEVERE GI UPSET   Social History   Socioeconomic History  . Marital status: Widowed    Spouse name: Not on file  . Number of children: Not on file  . Years of education: Not on file  . Highest education level: Not on file  Occupational History  . Not on file  Tobacco Use  . Smoking status: Never Smoker  . Smokeless tobacco: Never Used  Substance and Sexual Activity  . Alcohol use: No    Alcohol/week: 0.0 standard drinks  . Drug use: No  . Sexual activity: Yes    Birth control/protection: Post-menopausal, Surgical    Comment:  BTL-1st intercourse 74 yo-Fewer than 5 partners  Other Topics Concern  . Not on file  Social History Narrative  . Not on file   Social Determinants of Health   Financial Resource Strain:   . Difficulty of Paying Living Expenses:   Food Insecurity:   . Worried About Charity fundraiser in the Last Year:   . Arboriculturist in the Last Year:   Transportation Needs:   . Film/video editor (Medical):   Marland Kitchen Lack of Transportation (Non-Medical):   Physical Activity:   . Days of Exercise per Week:   . Minutes of Exercise per Session:   Stress:   . Feeling of Stress :   Social Connections:   . Frequency of Communication with Friends and Family:   . Frequency of Social Gatherings with Friends and Family:   . Attends Religious  Services:   . Active Member of Clubs or Organizations:   . Attends Archivist Meetings:   Marland Kitchen Marital Status:   Intimate Partner Violence:   . Fear of Current or Ex-Partner:   . Emotionally Abused:   Marland Kitchen Physically Abused:   . Sexually Abused:     Review of Systems  All other systems reviewed and are negative.      Objective:   Physical Exam Vitals reviewed.  Constitutional:      General: She is not in acute distress.    Appearance: Normal appearance. She is normal weight. She is not ill-appearing or toxic-appearing.  HENT:     Mouth/Throat:     Mouth: Mucous membranes are moist.     Pharynx: No oropharyngeal exudate or posterior oropharyngeal erythema.  Cardiovascular:     Rate and Rhythm: Normal rate and regular rhythm.     Heart sounds: Normal heart sounds.  Pulmonary:     Effort: Pulmonary effort is normal. No respiratory distress.     Breath sounds: Normal breath sounds. No stridor. No wheezing, rhonchi or rales.  Abdominal:     General: Abdomen is flat. Bowel sounds are normal. There is no distension.     Palpations: Abdomen is soft.     Tenderness: There is no abdominal tenderness. There is no guarding or rebound.     Hernia: No hernia is present.  Musculoskeletal:     Cervical back: Neck supple. No rigidity.  Lymphadenopathy:     Cervical: No cervical adenopathy.  Neurological:     Mental Status: She is alert.           Assessment & Plan:  Esophageal dysphagia - Plan: Lipase, CBC with Differential/Platelet, COMPLETE METABOLIC PANEL WITH GFR, H. pylori breath test  Depression, major, single episode, moderate (HCC)  Differential diagnosis for her dysphagia would be an esophageal stricture, GERD, or some type of esophageal mass.  Epigastric discomfort could be due to gastritis, peptic ulcer disease, GERD, or pancreatitis.  Begin by obtaining lab work including a CBC, CMP, lipase, and an H. pylori breath test.  Meanwhile start the patient on Protonix  40 mg daily empirically for GERD and possible gastritis.  Consult GI as I believe the patient may need an EGD to evaluate for stricture or obstructive mass.  Begin Lexapro 10 mg daily for depression and reassess in 4 to 6 weeks or sooner if worsening.

## 2020-03-17 LAB — CBC WITH DIFFERENTIAL/PLATELET
Absolute Monocytes: 587 cells/uL (ref 200–950)
Basophils Absolute: 41 cells/uL (ref 0–200)
Basophils Relative: 0.6 %
Eosinophils Absolute: 600 cells/uL — ABNORMAL HIGH (ref 15–500)
Eosinophils Relative: 8.7 %
HCT: 40.9 % (ref 35.0–45.0)
Hemoglobin: 13.4 g/dL (ref 11.7–15.5)
Lymphs Abs: 2284 cells/uL (ref 850–3900)
MCH: 30.3 pg (ref 27.0–33.0)
MCHC: 32.8 g/dL (ref 32.0–36.0)
MCV: 92.5 fL (ref 80.0–100.0)
MPV: 11.4 fL (ref 7.5–12.5)
Monocytes Relative: 8.5 %
Neutro Abs: 3388 cells/uL (ref 1500–7800)
Neutrophils Relative %: 49.1 %
Platelets: 245 10*3/uL (ref 140–400)
RBC: 4.42 10*6/uL (ref 3.80–5.10)
RDW: 12.2 % (ref 11.0–15.0)
Total Lymphocyte: 33.1 %
WBC: 6.9 10*3/uL (ref 3.8–10.8)

## 2020-03-17 LAB — COMPLETE METABOLIC PANEL WITH GFR
AG Ratio: 2 (calc) (ref 1.0–2.5)
ALT: 13 U/L (ref 6–29)
AST: 21 U/L (ref 10–35)
Albumin: 4.1 g/dL (ref 3.6–5.1)
Alkaline phosphatase (APISO): 55 U/L (ref 37–153)
BUN: 15 mg/dL (ref 7–25)
CO2: 31 mmol/L (ref 20–32)
Calcium: 9.4 mg/dL (ref 8.6–10.4)
Chloride: 102 mmol/L (ref 98–110)
Creat: 0.73 mg/dL (ref 0.60–0.93)
GFR, Est African American: 95 mL/min/{1.73_m2} (ref >=60)
GFR, Est Non African American: 82 mL/min/{1.73_m2} (ref >=60)
Globulin: 2.1 g/dL (calc) (ref 1.9–3.7)
Glucose, Bld: 91 mg/dL (ref 65–99)
Potassium: 4.1 mmol/L (ref 3.5–5.3)
Sodium: 139 mmol/L (ref 135–146)
Total Bilirubin: 0.5 mg/dL (ref 0.2–1.2)
Total Protein: 6.2 g/dL (ref 6.1–8.1)

## 2020-03-17 LAB — H. PYLORI BREATH TEST: H. pylori Breath Test: NOT DETECTED

## 2020-03-17 LAB — LIPASE: Lipase: 87 U/L — ABNORMAL HIGH (ref 7–60)

## 2020-03-18 ENCOUNTER — Other Ambulatory Visit: Payer: Self-pay | Admitting: Family Medicine

## 2020-03-18 DIAGNOSIS — K859 Acute pancreatitis without necrosis or infection, unspecified: Secondary | ICD-10-CM

## 2020-04-02 ENCOUNTER — Other Ambulatory Visit: Payer: Medicare Other

## 2020-04-06 ENCOUNTER — Ambulatory Visit
Admission: RE | Admit: 2020-04-06 | Discharge: 2020-04-06 | Disposition: A | Discharge status: Home / Self Care | Payer: Medicare Other | Source: Ambulatory Visit | Attending: Family Medicine | Admitting: Family Medicine

## 2020-04-06 DIAGNOSIS — K859 Acute pancreatitis without necrosis or infection, unspecified: Secondary | ICD-10-CM

## 2020-04-06 MED ORDER — IOPAMIDOL (ISOVUE-300) INJECTION 61%
100.0000 mL | Freq: Once | INTRAVENOUS | Status: AC | PRN
  Administered 2020-04-06: 100 mL via INTRAVENOUS

## 2020-05-12 ENCOUNTER — Encounter: Payer: Self-pay | Admitting: Gastroenterology

## 2020-05-12 ENCOUNTER — Ambulatory Visit (INDEPENDENT_AMBULATORY_CARE_PROVIDER_SITE_OTHER): Payer: Medicare Other | Admitting: Gastroenterology

## 2020-05-12 VITALS — BP 100/60 | HR 79 | Ht 65.5 in | Wt 106.5 lb

## 2020-05-12 DIAGNOSIS — E43 Unspecified severe protein-calorie malnutrition: Secondary | ICD-10-CM

## 2020-05-12 DIAGNOSIS — R634 Abnormal weight loss: Secondary | ICD-10-CM

## 2020-05-12 DIAGNOSIS — R1310 Dysphagia, unspecified: Secondary | ICD-10-CM

## 2020-05-12 DIAGNOSIS — K5904 Chronic idiopathic constipation: Secondary | ICD-10-CM | POA: Diagnosis not present

## 2020-05-12 NOTE — Progress Notes (Signed)
Sheryl Huang    [id number]    [birth year]  Primary Care Physician:Pickard, Cammie Mcgee, MD  Referring Physician: Susy Frizzle, MD [address] [facility] [place],  [place] [address]   Chief complaint:  Dysphagia  HPI:  74 yr old very pleasant female here for evaluation of trouble swallowing.  She started having trouble swallowing with food and pills somewhere around March of this year.  She had Covid infection in January and she feels since then she has not been eating well.  She lost her husband in January and was grieving.  She has lost 10 to 15 pounds in the past 6 months.  Dysphagia is predominantly worse with solids and pills, occasionally with liquids as well  Symptoms are somewhat better since she started taking pantoprazole daily, no longer hurting when she tries to swallow but she continues to have mild discomfort and she feels the food gets hung up in her throat.  Irregular bowel habits with alternating constipation and diarrhea, was taking MiraLAX but stopped taking it as it was not helping and she was getting nauseous  Colonoscopy 07/10/2018: 7 mm polyp [tubular adenoma], sigmoid diverticulosis, AVM in sigmoid colon.   EGD 02/26/1997 + H.pylori, unable to review the rest of the report as the handwritten scanned document is not very legible  CT abd & pelvis 04/06/20: Circumferential distal esophageal wall thickening. Large colonic stool burden  Outpatient Encounter Medications as of 05/12/2020  Medication Sig  . Calcium Carbonate-Vitamin D (CALCIUM 600 + D PO) Take 1 tablet by mouth 2 (two) times daily.  . cholecalciferol (VITAMIN D) 1000 UNITS tablet Take 1,000 Units by mouth daily.  Marland Kitchen denosumab (PROLIA) 60 MG/ML SOLN injection Inject 60 mg every 6 (six) months into the skin. Administer in upper arm, thigh, or abdomen  . escitalopram (LEXAPRO) 10 MG tablet Take 1 tablet (10 mg total) by mouth daily.  . fluticasone (CUTIVATE) 0.05 % cream fluticasone  propionate 0.05 % topical cream  . pantoprazole (PROTONIX) 40 MG tablet Take 1 tablet (40 mg total) by mouth daily.  Marland Kitchen POTASSIUM PO Take 1 tablet by mouth daily.    No facility-administered encounter medications on file as of 05/12/2020.    Allergies as of 05/12/2020 - Review Complete 05/12/2020  Allergen Reaction Noted  . Erythromycin      Past Medical History:  Diagnosis Date  . Allergy   . Anemia   . Cataract    removed both eyes  . History of shingles   . Osteoporosis 11/2016   T score 3.8  . Postmenopausal 02/11/2020    Past Surgical History:  Procedure Laterality Date  . APPENDECTOMY  1969  . BREAST BIOPSY  2001   left and right  . CATARACT EXTRACTION, BILATERAL    . COLONOSCOPY  2014   neg  . OVARIAN CYST REMOVAL  1995  . TUBAL LIGATION  1992    Family History  Problem Relation Age of Onset  . Hypertension Mother   . Stroke Mother 23  . Hypertension Father   . Heart attack Father   . Colon cancer Brother   . Heart attack Paternal Grandmother        ? age  . Heart attack Maternal Uncle   . Coronary artery disease Paternal Aunt   . Coronary artery disease Paternal Uncle   . Heart attack Maternal Grandfather   . Diabetes Neg Hx   . Colon polyps Neg  Hx   . Esophageal cancer Neg Hx   . Rectal cancer Neg Hx   . Stomach cancer Neg Hx     Social History   Socioeconomic History  . Marital status: Widowed    Spouse name: Not on file  . Number of children: Not on file  . Years of education: Not on file  . Highest education level: Not on file  Occupational History  . Not on file  Tobacco Use  . Smoking status: Never Smoker  . Smokeless tobacco: Never Used  Vaping Use  . Vaping Use: Never used  Substance and Sexual Activity  . Alcohol use: No    Alcohol/week: 0.0 standard drinks  . Drug use: No  . Sexual activity: Yes    Birth control/protection: Post-menopausal, Surgical    Comment: BTL-1st intercourse 74 yo-Fewer than 5 partners  Other Topics  Concern  . Not on file  Social History Narrative  . Not on file   Social Determinants of Health   Financial Resource Strain:   . Difficulty of Paying Living Expenses:   Food Insecurity:   . Worried About Charity fundraiser in the Last Year:   . Arboriculturist in the Last Year:   Transportation Needs:   . Film/video editor (Medical):   Marland Kitchen Lack of Transportation (Non-Medical):   Physical Activity:   . Days of Exercise per Week:   . Minutes of Exercise per Session:   Stress:   . Feeling of Stress :   Social Connections:   . Frequency of Communication with Friends and Family:   . Frequency of Social Gatherings with Friends and Family:   . Attends Religious Services:   . Active Member of Clubs or Organizations:   . Attends Archivist Meetings:   Marland Kitchen Marital Status:   Intimate Partner Violence:   . Fear of Current or Ex-Partner:   . Emotionally Abused:   Marland Kitchen Physically Abused:   . Sexually Abused:       Review of systems:  All other review of systems negative except as mentioned in the HPI.   Physical Exam: Vitals:   05/12/20 0930  BP: 100/60  Pulse: 79   Body mass index is 17.45 kg/m. Gen:      No acute distress HEENT:  sclera anicteric Abd:      soft, non-tender; no palpable masses, no distension Ext:    No edema Neuro: alert and oriented x 3 Psych: normal mood and affect  Data Reviewed:  Reviewed labs, radiology imaging, old records and pertinent past GI work up   Assessment and Plan/Recommendations:  74 year old female with solid and pill dysphagia  CT abdomen pelvis with distal esophageal thickening  Differential includes reflux esophagitis, Candida esophagitis and will need to exclude neoplasia or malignancy  Continue Protonix 40 mg daily Antireflux measures  Soft foods until EGD to prevent food impaction  Schedule for EGD to further evaluate, esophageal biopsies and esophageal dilation as needed The risks and benefits as well as  alternatives of endoscopic procedure(s) have been discussed and reviewed. All questions answered. The patient agrees to proceed.  Chronic idiopathic constipation: Start docusate 1 to 2 tablets daily Increase fluid intake and dietary fiber  Weight loss and protein energy malnutrition: Secondary to dysphagia and decreased appetite Advised patient to eat small frequent meals with high-calorie and high-protein diet Drink [employer] Breakfast once or twice daily  Return in 3 months or sooner if needed  This visit required >  40 minutes of patient care (this includes precharting, chart review, review of results, face-to-face time used for counseling as well as treatment plan and follow-up. The patient was provided an opportunity to ask questions and all were answered. The patient agreed with the plan and demonstrated an understanding of the instructions.  Damaris Hippo , MD    CC: Susy Frizzle, MD

## 2020-05-12 NOTE — Patient Instructions (Signed)
You have been scheduled for an endoscopy. Please follow written instructions given to you at your visit today. If you use inhalers (even only as needed), please bring them with you on the day of your procedure. Your physician has requested that you go to www.startemmi.com and enter the access code given to you at your visit today. This web site gives a general overview about your procedure. However, you should still follow specific instructions given to you by our office regarding your preparation for the procedure.  Continue Protonix daily  Eat small frequent meals  Eat a soft diet  Increase water intake  Take stool softeners 1-2 docusates daily  Follow up in 3 months  I appreciate the  opportunity to care for you  Thank You   Harl Bowie , MD

## 2020-05-19 ENCOUNTER — Encounter: Payer: Self-pay | Admitting: Gynecology

## 2020-05-19 ENCOUNTER — Encounter: Payer: Self-pay | Admitting: Gastroenterology

## 2020-05-19 ENCOUNTER — Ambulatory Visit (AMBULATORY_SURGERY_CENTER): Payer: Medicare Other | Admitting: Gastroenterology

## 2020-05-19 VITALS — BP 112/68 | HR 71 | Temp 97.7°F | Resp 17 | Ht 65.0 in | Wt 106.0 lb

## 2020-05-19 DIAGNOSIS — K222 Esophageal obstruction: Secondary | ICD-10-CM

## 2020-05-19 DIAGNOSIS — R933 Abnormal findings on diagnostic imaging of other parts of digestive tract: Secondary | ICD-10-CM | POA: Diagnosis not present

## 2020-05-19 DIAGNOSIS — K2970 Gastritis, unspecified, without bleeding: Secondary | ICD-10-CM

## 2020-05-19 DIAGNOSIS — R1310 Dysphagia, unspecified: Secondary | ICD-10-CM

## 2020-05-19 DIAGNOSIS — K449 Diaphragmatic hernia without obstruction or gangrene: Secondary | ICD-10-CM | POA: Diagnosis not present

## 2020-05-19 DIAGNOSIS — R634 Abnormal weight loss: Secondary | ICD-10-CM

## 2020-05-19 DIAGNOSIS — K2980 Duodenitis without bleeding: Secondary | ICD-10-CM

## 2020-05-19 DIAGNOSIS — R935 Abnormal findings on diagnostic imaging of other abdominal regions, including retroperitoneum: Secondary | ICD-10-CM

## 2020-05-19 MED ORDER — SODIUM CHLORIDE 0.9 % IV SOLN: 500.0000 mL | Freq: Once | INTRAVENOUS | Status: DC

## 2020-05-19 NOTE — Patient Instructions (Signed)
YOU HAD AN ENDOSCOPIC PROCEDURE TODAY AT [facility]:   Refer to the procedure report that was given to you for any specific questions about what was found during the examination.  If the procedure report does not answer your questions, please call your gastroenterologist to clarify.  If you requested that your care partner not be given the details of your procedure findings, then the procedure report has been included in a sealed envelope for you to review at your convenience later.  YOU SHOULD EXPECT: Some feelings of bloating in the abdomen. Passage of more gas than usual.  Walking can help get rid of the air that was put into your GI tract during the procedure and reduce the bloating. If you had a lower endoscopy (such as a colonoscopy or flexible sigmoidoscopy) you may notice spotting of blood in your stool or on the toilet paper. If you underwent a bowel prep for your procedure, you may not have a normal bowel movement for a few days.  Please Note:  You might notice some irritation and congestion in your nose or some drainage.  This is from the oxygen used during your procedure.  There is no need for concern and it should clear up in a day or so.  SYMPTOMS TO REPORT IMMEDIATELY:     Following upper endoscopy (EGD)  Vomiting of blood or coffee ground material  New chest pain or pain under the shoulder blades  Painful or persistently difficult swallowing  New shortness of breath  Fever of 100F or higher  Black, tarry-looking stools  For urgent or emergent issues, a gastroenterologist can be reached at any hour by calling [phone]. Do not use MyChart messaging for urgent concerns.    DIET:   FOLLOW DILATION HANDOUT,SOFT DIET FOR 3 DAYS.  ACTIVITY:  You should plan to take it easy for the rest of today and you should NOT DRIVE or use heavy machinery until tomorrow (because of the sedation medicines used during the test).    FOLLOW UP: Our staff will call the  number listed on your records 48-72 hours following your procedure to check on you and address any questions or concerns that you may have regarding the information given to you following your procedure. If we do not reach you, we will leave a message.  We will attempt to reach you two times.  During this call, we will ask if you have developed any symptoms of COVID 19. If you develop any symptoms (ie: fever, flu-like symptoms, shortness of breath, cough etc.) before then, please call [phone].  If you test positive for Covid 19 in the 2 weeks post procedure, please call and report this information to [place].    If any biopsies were taken you will be contacted by phone or by letter within the next 1-3 weeks.  Please call us at [phone] if you have not heard about the biopsies in 3 weeks.    SIGNATURES/CONFIDENTIALITY: You and/or your care partner have signed paperwork which will be entered into your electronic medical record.  These signatures attest to the fact that that the information above on your After Visit Summary has been reviewed and is understood.  Full responsibility of the confidentiality of this discharge information lies with you and/or your care-partner.  Resume medications.Information given on [place],Follow Dilation Handout,SOFT DIET FOR 3 DAYS.

## 2020-05-19 NOTE — Progress Notes (Signed)
SS - Front Desk CM - VS

## 2020-05-19 NOTE — Progress Notes (Signed)
A/ox3, pleased with MAC, report to RN 

## 2020-05-19 NOTE — Op Note (Signed)
[place] Patient Name: Teniyah Seivert Procedure Date: 05/19/2020 10:55 AM MRN: [id number] Endoscopist: Mauri Pole , MD Age: 74 Referring MD:  Date of Birth: August 05, 1946 Gender: Female Account #: [id number] Procedure:                Upper GI endoscopy Indications:              Dysphagia, Abnormal CT of the GI tract, Exclusion                            of Helicobacter pylori (H/o H.pylori in the remote                            past s/p treatment, eradication was not confirmed),                            Weight loss Medicines:                Monitored Anesthesia Care Procedure:                Pre-Anesthesia Assessment:                           - Prior to the procedure, a History and Physical                            was performed, and patient medications and                            allergies were reviewed. The patient's tolerance of                            previous anesthesia was also reviewed. The risks                            and benefits of the procedure and the sedation                            options and risks were discussed with the patient.                            All questions were answered, and informed consent                            was obtained. Prior Anticoagulants: The patient has                            taken no previous anticoagulant or antiplatelet                            agents. ASA Grade Assessment: II - A patient with                            mild systemic disease. After reviewing the risks  and benefits, the patient was deemed in                            satisfactory condition to undergo the procedure.                           After obtaining informed consent, the endoscope was                            passed under direct vision. Throughout the                            procedure, the patient's blood pressure, pulse, and                            oxygen saturations were monitored  continuously. The                            Endoscope was introduced through the mouth, and                            advanced to the second part of duodenum. The upper                            GI endoscopy was accomplished without difficulty.                            The patient tolerated the procedure well. Scope In: Scope Out: Findings:                 The Z-line was regular and was found 37 cm from the                            incisors.                           One benign-appearing, intrinsic moderate                            (circumferential scarring or stenosis; an endoscope                            may pass) stenosis was found 36 to 37 cm from the                            incisors. This stenosis measured 1.5 cm (inner                            diameter) x less than one cm (in length). The                            stenosis was traversed. A TTS dilator was passed  through the scope. Dilation with a 16-17-18 mm                            balloon dilator was performed to 18 mm. The                            dilation site was examined following endoscope                            reinsertion and showed mild mucosal disruption.                           A small hiatal hernia was present.                           Patchy mild inflammation characterized by                            congestion (edema) and erythema was found in the                            entire examined stomach. Biopsies were taken with a                            cold forceps for Helicobacter pylori testing.                           Patchy mild inflammation characterized by                            congestion (edema), erosions, erythema and                            friability was found in the first portion of the                            duodenum and in the second portion of the duodenum.                            Biopsies for histology were taken with a cold                             forceps for evaluation of celiac disease. Complications:            No immediate complications. Estimated Blood Loss:     Estimated blood loss was minimal. Impression:               - Z-line regular, 37 cm from the incisors.                           - Benign-appearing esophageal stenosis. Dilated.                           - Small hiatal hernia.                           -  Gastritis. Biopsied.                           - Duodenitis. Biopsied. Recommendation:           - Patient has a contact number available for                            emergencies. The signs and symptoms of potential                            delayed complications were discussed with the                            patient. Return to normal activities tomorrow.                            Written discharge instructions were provided to the                            patient.                           - Soft diet X 3 days and then resume previous diet.                           - Avoid NSAID's                           - Continue Pantoprazole daily, 30 mins before                            breakfast                           - Continue present medications.                           - Await pathology results.                           - Return to GI office in 2 months.                           - Follow an antireflux regimen indefinitely. Mauri Pole, MD 05/19/2020 11:25:24 AM This report has been signed electronically.

## 2020-05-20 NOTE — Telephone Encounter (Signed)
PROLIA GIVEN 02/24/2020 NEXT INJECTION 08/26/2020

## 2020-05-21 ENCOUNTER — Telehealth: Payer: Self-pay | Admitting: *Deleted

## 2020-05-21 NOTE — Telephone Encounter (Signed)
  Follow up Call-  Call back number 05/19/2020 07/10/2018  Post procedure Call Back phone  # [phone] cell [phone]  Permission to leave phone message Yes Yes  Some recent data might be hidden     Patient questions:  Do you have a fever, pain , or abdominal swelling? No. Pain Score  0 *  Have you tolerated food without any problems? Yes.    Have you been able to return to your normal activities? Yes.    Do you have any questions about your discharge instructions: Diet   No. Medications  No. Follow up visit  No.  Do you have questions or concerns about your Care? No.  Actions: * If pain score is 4 or above: 1. No action needed, pain <4.Have you developed a fever since your procedure? no  2.   Have you had an respiratory symptoms (SOB or cough) since your procedure? no  3.   Have you tested positive for COVID 19 since your procedure no  4.   Have you had any family members/close contacts diagnosed with the COVID 19 since your procedure?  no   If yes to any of these questions please route to Joylene John, RN and Erenest Rasher, RN

## 2020-05-25 ENCOUNTER — Encounter: Payer: Self-pay | Admitting: Gastroenterology

## 2020-06-12 DIAGNOSIS — Z20822 Contact with and (suspected) exposure to covid-19: Secondary | ICD-10-CM | POA: Diagnosis not present

## 2020-06-12 DIAGNOSIS — Z03818 Encounter for observation for suspected exposure to other biological agents ruled out: Secondary | ICD-10-CM | POA: Diagnosis not present

## 2020-07-07 ENCOUNTER — Other Ambulatory Visit: Payer: Self-pay | Admitting: Family Medicine

## 2020-07-15 ENCOUNTER — Telehealth: Payer: Self-pay | Admitting: *Deleted

## 2020-07-15 NOTE — Telephone Encounter (Signed)
Deductible $203($247met)  OOP MAX n/a  Annual exam 02/11/2020  Calcium 9.4            Date 03/16/2020  Upcoming dental procedures NO  Prior Authorization needed NO  Pt estimated Cost $0  appt 08/26/2020     Coverage Details: $0 ONE DOSE,$0 ADMIN FEE

## 2020-07-22 DIAGNOSIS — H40013 Open angle with borderline findings, low risk, bilateral: Secondary | ICD-10-CM | POA: Diagnosis not present

## 2020-07-22 DIAGNOSIS — Z961 Presence of intraocular lens: Secondary | ICD-10-CM | POA: Diagnosis not present

## 2020-07-22 DIAGNOSIS — H18513 Endothelial corneal dystrophy, bilateral: Secondary | ICD-10-CM | POA: Diagnosis not present

## 2020-08-11 ENCOUNTER — Ambulatory Visit (INDEPENDENT_AMBULATORY_CARE_PROVIDER_SITE_OTHER): Payer: Medicare Other

## 2020-08-11 ENCOUNTER — Other Ambulatory Visit: Payer: Self-pay

## 2020-08-11 DIAGNOSIS — Z23 Encounter for immunization: Secondary | ICD-10-CM | POA: Diagnosis not present

## 2020-08-26 ENCOUNTER — Other Ambulatory Visit: Payer: Self-pay

## 2020-08-26 ENCOUNTER — Ambulatory Visit (INDEPENDENT_AMBULATORY_CARE_PROVIDER_SITE_OTHER): Payer: Medicare Other | Admitting: *Deleted

## 2020-08-26 DIAGNOSIS — M810 Age-related osteoporosis without current pathological fracture: Secondary | ICD-10-CM | POA: Diagnosis not present

## 2020-08-26 MED ORDER — DENOSUMAB 60 MG/ML ~~LOC~~ SOSY
60.0000 mg | PREFILLED_SYRINGE | Freq: Once | SUBCUTANEOUS | Status: AC
  Administered 2020-08-26: 60 mg via SUBCUTANEOUS

## 2020-08-26 NOTE — Progress Notes (Signed)
pro

## 2020-09-20 DIAGNOSIS — Z23 Encounter for immunization: Secondary | ICD-10-CM | POA: Diagnosis not present

## 2020-09-27 ENCOUNTER — Encounter: Payer: Self-pay | Admitting: Gastroenterology

## 2020-09-27 ENCOUNTER — Ambulatory Visit (INDEPENDENT_AMBULATORY_CARE_PROVIDER_SITE_OTHER): Payer: Medicare Other | Admitting: Gastroenterology

## 2020-09-27 VITALS — BP 102/58 | HR 84 | Ht 64.5 in | Wt 109.0 lb

## 2020-09-27 DIAGNOSIS — Z86010 Personal history of colonic polyps: Secondary | ICD-10-CM | POA: Diagnosis not present

## 2020-09-27 DIAGNOSIS — K222 Esophageal obstruction: Secondary | ICD-10-CM | POA: Diagnosis not present

## 2020-09-27 DIAGNOSIS — K2100 Gastro-esophageal reflux disease with esophagitis, without bleeding: Secondary | ICD-10-CM

## 2020-09-27 DIAGNOSIS — K5904 Chronic idiopathic constipation: Secondary | ICD-10-CM | POA: Diagnosis not present

## 2020-09-27 MED ORDER — PANTOPRAZOLE SODIUM 40 MG PO TBEC
40.0000 mg | DELAYED_RELEASE_TABLET | Freq: Every day | ORAL | 3 refills | Status: DC
Start: 2020-09-27 — End: 2021-03-29

## 2020-09-27 NOTE — Patient Instructions (Addendum)
We have refilled your Protonix today  Follow up in 1 year   Gastroesophageal Reflux Disease, Adult Gastroesophageal reflux (GER) happens when acid from the stomach flows up into the tube that connects the mouth and the stomach (esophagus). Normally, food travels down the esophagus and stays in the stomach to be digested. However, when a person has GER, food and stomach acid sometimes move back up into the esophagus. If this becomes a more serious problem, the person may be diagnosed with a disease called gastroesophageal reflux disease (GERD). GERD occurs when the reflux:  Happens often.  Causes frequent or severe symptoms.  Causes problems such as damage to the esophagus. When stomach acid comes in contact with the esophagus, the acid may cause soreness (inflammation) in the esophagus. Over time, GERD may create small holes (ulcers) in the lining of the esophagus. What are the causes? This condition is caused by a problem with the muscle between the esophagus and the stomach (lower esophageal sphincter, or LES). Normally, the LES muscle closes after food passes through the esophagus to the stomach. When the LES is weakened or abnormal, it does not close properly, and that allows food and stomach acid to go back up into the esophagus. The LES can be weakened by certain dietary substances, medicines, and medical conditions, including:  Tobacco use.  Pregnancy.  Having a hiatal hernia.  Alcohol use.  Certain foods and beverages, such as coffee, chocolate, onions, and peppermint. What increases the risk? You are more likely to develop this condition if you:  Have an increased body weight.  Have a connective tissue disorder.  Use NSAID medicines. What are the signs or symptoms? Symptoms of this condition include:  Heartburn.  Difficult or painful swallowing.  The feeling of having a lump in the throat.  Abitter taste in the mouth.  Bad breath.  Having a large amount of  saliva.  Having an upset or bloated stomach.  Belching.  Chest pain. Different conditions can cause chest pain. Make sure you see your health care provider if you experience chest pain.  Shortness of breath or wheezing.  Ongoing (chronic) cough or a night-time cough.  Wearing away of tooth enamel.  Weight loss. How is this diagnosed? Your health care provider will take a medical history and perform a physical exam. To determine if you have mild or severe GERD, your health care provider may also monitor how you respond to treatment. You may also have tests, including:  A test to examine your stomach and esophagus with a small camera (endoscopy).  A test thatmeasures the acidity level in your esophagus.  A test thatmeasures how much pressure is on your esophagus.  A barium swallow or modified barium swallow test to show the shape, size, and functioning of your esophagus. How is this treated? The goal of treatment is to help relieve your symptoms and to prevent complications. Treatment for this condition may vary depending on how severe your symptoms are. Your health care provider may recommend:  Changes to your diet.  Medicine.  Surgery. Follow these instructions at home: Eating and drinking   Follow a diet as recommended by your health care provider. This may involve avoiding foods and drinks such as: ? Coffee and tea (with or without caffeine). ? Drinks that containalcohol. ? Energy drinks and sports drinks. ? Carbonated drinks or sodas. ? Chocolate and cocoa. ? Peppermint and mint flavorings. ? Garlic and onions. ? Horseradish. ? Spicy and acidic foods, including peppers, chili powder,  curry powder, vinegar, hot sauces, and barbecue sauce. ? Citrus fruit juices and citrus fruits, such as oranges, lemons, and limes. ? Tomato-based foods, such as red sauce, chili, salsa, and pizza with red sauce. ? Fried and fatty foods, such as donuts, french fries, potato chips,  and high-fat dressings. ? High-fat meats, such as hot dogs and fatty cuts of red and white meats, such as rib eye steak, sausage, ham, and bacon. ? High-fat dairy items, such as whole milk, butter, and cream cheese.  Eat small, frequent meals instead of large meals.  Avoid drinking large amounts of liquid with your meals.  Avoid eating meals during the 2-3 hours before bedtime.  Avoid lying down right after you eat.  Do not exercise right after you eat. Lifestyle   Do not use any products that contain nicotine or tobacco, such as cigarettes, e-cigarettes, and chewing tobacco. If you need help quitting, ask your health care provider.  Try to reduce your stress by using methods such as yoga or meditation. If you need help reducing stress, ask your health care provider.  If you are overweight, reduce your weight to an amount that is healthy for you. Ask your health care provider for guidance about a safe weight loss goal. General instructions  Pay attention to any changes in your symptoms.  Take over-the-counter and prescription medicines only as told by your health care provider. Do not take aspirin, ibuprofen, or other NSAIDs unless your health care provider told you to do so.  Wear loose-fitting clothing. Do not wear anything tight around your waist that causes pressure on your abdomen.  Raise (elevate) the head of your bed about 6 inches (15 cm).  Avoid bending over if this makes your symptoms worse.  Keep all follow-up visits as told by your health care provider. This is important. Contact a health care provider if:  You have: ? New symptoms. ? Unexplained weight loss. ? Difficulty swallowing or it hurts to swallow. ? Wheezing or a persistent cough. ? A hoarse voice.  Your symptoms do not improve with treatment. Get help right away if you:  Have pain in your arms, neck, jaw, teeth, or back.  Feel sweaty, dizzy, or light-headed.  Have chest pain or shortness of  breath.  Vomit and your vomit looks like blood or coffee grounds.  Faint.  Have stool that is bloody or black.  Cannot swallow, drink, or eat. Summary  Gastroesophageal reflux happens when acid from the stomach flows up into the esophagus. GERD is a disease in which the reflux happens often, causes frequent or severe symptoms, or causes problems such as damage to the esophagus.  Treatment for this condition may vary depending on how severe your symptoms are. Your health care provider may recommend diet and lifestyle changes, medicine, or surgery.  Contact a health care provider if you have new or worsening symptoms.  Take over-the-counter and prescription medicines only as told by your health care provider. Do not take aspirin, ibuprofen, or other NSAIDs unless your health care provider told you to do so.  Keep all follow-up visits as told by your health care provider. This is important. This information is not intended to replace advice given to you by your health care provider. Make sure you discuss any questions you have with your health care provider. Document Revised: 04/24/2018 Document Reviewed: 04/24/2018 Elsevier Patient Education  [employer].   I appreciate the  opportunity to care for you  Thank You  Harl Bowie , MD

## 2020-09-27 NOTE — Progress Notes (Signed)
Sheryl Huang    [id number]    August 15, 1946  Primary Care Physician:Pickard, Cammie Mcgee, MD  Referring Physician: Susy Frizzle, MD [address] [place],  [place] [id number]   Chief complaint:  GERD, weight loss  HPI: 74 year old very pleasant female here for follow-up for dysphagia and GERD  Overall her symptoms have significantly improved after EGD with dilation.  She is no longer choking on food.  She is trying to gain some weight, no longer losing weight Denies any nausea, vomiting, abdominal pain, melena or bright red blood per rectum  EGD May 19, 2020: Small hiatal hernia.  Gastritis and duodenitis.  Benign esophageal stricture s/p dilation to 18 mm  Colonoscopy 07/10/2018: 7 mm polyp [tubular adenoma], sigmoid diverticulosis, AVM in sigmoid colon.   EGD 02/26/1997 + H.pylori, unable to review the rest of the report as the handwritten scanned document is not very legible  CT abd & pelvis 04/06/20: Circumferential distal esophageal wall thickening. Large colonic stool burden  Outpatient Encounter Medications as of 09/27/2020  Medication Sig   Calcium Carbonate-Vitamin D (CALCIUM 600 + D PO) Take 1 tablet by mouth 2 (two) times daily.   cholecalciferol (VITAMIN D) 1000 UNITS tablet Take 1,000 Units by mouth daily.   denosumab (PROLIA) 60 MG/ML SOLN injection Inject 60 mg every 6 (six) months into the skin. Administer in upper arm, thigh, or abdomen   escitalopram (LEXAPRO) 10 MG tablet TAKE 1 TABLET BY MOUTH EVERY DAY   fluticasone (CUTIVATE) 0.05 % cream as needed.    pantoprazole (PROTONIX) 40 MG tablet TAKE 1 TABLET BY MOUTH EVERY DAY   POTASSIUM PO Take 1 tablet by mouth daily.    No facility-administered encounter medications on file as of 09/27/2020.    Allergies as of 09/27/2020 - Review Complete 09/27/2020  Allergen Reaction Noted   Erythromycin      Past Medical History:  Diagnosis Date   Allergy    Anemia    Cataract     removed both eyes   GERD (gastroesophageal reflux disease)    History of shingles    Osteoporosis 11/2016   T score 3.8   Postmenopausal 02/11/2020    Past Surgical History:  Procedure Laterality Date   APPENDECTOMY  1969   BREAST BIOPSY  2001   left and right   CATARACT EXTRACTION, BILATERAL     COLONOSCOPY  2014   neg   OVARIAN CYST REMOVAL  1995   TUBAL LIGATION  1992    Family History  Problem Relation Age of Onset   Hypertension Mother    Stroke Mother 94   Hypertension Father    Heart attack Father    Colon cancer Brother    Heart attack Paternal Grandmother        ? age   Heart attack Maternal Uncle    Coronary artery disease Paternal Aunt    Coronary artery disease Paternal Uncle    Heart attack Maternal Grandfather    Diabetes Neg Hx    Colon polyps Neg Hx    Esophageal cancer Neg Hx    Rectal cancer Neg Hx    Stomach cancer Neg Hx     Social History   Socioeconomic History   Marital status: Widowed    Spouse name: Not on file   Number of children: Not on file   Years of education: Not on file   Highest education level: Not on file  Occupational History   Not on file  Tobacco Use   Smoking status: Never Smoker   Smokeless tobacco: Never Used  Vaping Use   Vaping Use: Never used  Substance and Sexual Activity   Alcohol use: No    Alcohol/week: 0.0 standard drinks   Drug use: No   Sexual activity: Yes    Birth control/protection: Post-menopausal, Surgical    Comment: BTL-1st intercourse 74 yo-Fewer than 5 partners  Other Topics Concern   Not on file  Social History Narrative   Not on file   Social Determinants of Health   Financial Resource Strain:    Difficulty of Paying Living Expenses: Not on file  Food Insecurity:    Worried About Charity fundraiser in the Last Year: Not on file   [employer] of Food in the Last Year: Not on file  Transportation Needs:    Lack of Transportation (Medical): Not  on file   Lack of Transportation (Non-Medical): Not on file  Physical Activity:    Days of Exercise per Week: Not on file   Minutes of Exercise per Session: Not on file  Stress:    Feeling of Stress : Not on file  Social Connections:    Frequency of Communication with Friends and Family: Not on file   Frequency of Social Gatherings with Friends and Family: Not on file   Attends Religious Services: Not on file   Active Member of Clubs or Organizations: Not on file   Attends Archivist Meetings: Not on file   Marital Status: Not on file  Intimate Partner Violence:    Fear of Current or Ex-Partner: Not on file   Emotionally Abused: Not on file   Physically Abused: Not on file   Sexually Abused: Not on file      Review of systems: All other review of systems negative except as mentioned in the HPI.   Physical Exam: Vitals:   09/27/20 1431  BP: (!) 102/58  Pulse: 84   Body mass index is 18.42 kg/m. Gen:      No acute distress HEENT:  sclera anicteric Abd:      soft, non-tender; no palpable masses, no distension Ext:    No edema Neuro: alert and oriented x 3 Psych: normal mood and affect  Data Reviewed:  Reviewed labs, radiology imaging, old records and pertinent past GI work up   Assessment and Plan/Recommendations:  74 year old very pleasant female with history of GERD and dysphagia due to esophageal stricture s/p esophageal dilation  Dysphagia: Improved  GERD: Continue pantoprazole 40 mg daily and antireflux measures  Idiopathic constipation: Continue with high-fiber diet and increase fluid intake Continue stool softeners  History of adenomatous colon polyp: Due for recall colonoscopy 2027  Return in 1 year or sooner if needed  The patient was provided an opportunity to ask questions and all were answered. The patient agreed with the plan and demonstrated an understanding of the instructions.  Damaris Hippo , MD    CC:  Susy Frizzle, MD

## 2020-09-30 ENCOUNTER — Encounter: Payer: Self-pay | Admitting: Gastroenterology

## 2020-12-23 NOTE — Telephone Encounter (Signed)
PROLIA GIVEN 08/26/2020 NEXT INJECTION 02/25/2021

## 2021-02-08 ENCOUNTER — Telehealth: Payer: Self-pay | Admitting: *Deleted

## 2021-02-08 NOTE — Telephone Encounter (Addendum)
Deductible N/A  OOP MAX N/A  Annual exam UPCOMING VISIT 02/16/2021  Calcium    9.4         Date 03/13/2020  Upcoming dental procedures   Prior Authorization needed NO  Pt estimated Cost $0  APPT 02/26/2020    Coverage Details: 0% ONE DOSE,0% ADMIN FEE

## 2021-02-16 ENCOUNTER — Other Ambulatory Visit: Payer: Self-pay

## 2021-02-16 ENCOUNTER — Encounter: Payer: Self-pay | Admitting: Nurse Practitioner

## 2021-02-16 ENCOUNTER — Ambulatory Visit (INDEPENDENT_AMBULATORY_CARE_PROVIDER_SITE_OTHER): Payer: Medicare Other | Admitting: Nurse Practitioner

## 2021-02-16 VITALS — BP 120/76 | Ht 66.0 in | Wt 109.0 lb

## 2021-02-16 DIAGNOSIS — Z01419 Encounter for gynecological examination (general) (routine) without abnormal findings: Secondary | ICD-10-CM | POA: Diagnosis not present

## 2021-02-16 DIAGNOSIS — M810 Age-related osteoporosis without current pathological fracture: Secondary | ICD-10-CM

## 2021-02-16 DIAGNOSIS — Z780 Asymptomatic menopausal state: Secondary | ICD-10-CM

## 2021-02-16 NOTE — Progress Notes (Signed)
   MERIDA ALCANTAR October 22, 1946 [id number]   History:  75 y.o. G1P1001 presents for breast and pelvic exam. No GYN complaints. Postmenopausal - no HRT, no bleeding. Osteoporosis, on Prolia with stabilization. Normal pap and mammogram history.   Gynecologic History No LMP recorded. Patient is postmenopausal.   Contraception: post menopausal status  Health Maintenance Last Pap: 10/2016. Results were: normal Last mammogram: 05/06/2020. Results were: normal Last colonoscopy: 2019. Results: benign polyps, 5-year recall Last Dexa: 02/24/2020. Results were: lowest T-score -3.4, stable  Past medical history, past surgical history, family history and social history were all reviewed and documented in the EPIC chart. Brother with history of colon cancer. Widowed. Daughter in [place], [place] and 45 yo grandchildren.  ROS:  A ROS was performed and pertinent positives and negatives are included.  Exam:  Vitals:   02/16/21 0931  BP: 120/76  Weight: 109 lb (49.4 kg)  Height: 5\' 6"  (1.676 m)   Body mass index is 17.59 kg/m.  General appearance:  Normal Thyroid:  Symmetrical, normal in size, without palpable masses or nodularity. Respiratory  Auscultation:  Clear without wheezing or rhonchi Cardiovascular  Auscultation:  Regular rate, without rubs, murmurs or gallops  Edema/varicosities:  Not grossly evident Abdominal  Soft,nontender, without masses, guarding or rebound.  Liver/spleen:  No organomegaly noted  Hernia:  None appreciated  Skin  Inspection:  Grossly normal Breasts: Examined lying and sitting.   Right: Without masses, retractions, nipple discharge or axillary adenopathy.   Left: Without masses, retractions, nipple discharge or axillary adenopathy. Gentitourinary   Inguinal/mons:  Normal without inguinal adenopathy  External genitalia:  Normal appearing vulva with no masses, tenderness, or lesions  BUS/Urethra/Skene's glands:  Normal  Vagina:  Normal appearing with normal color and  discharge, no lesions. Atrophic changes  Cervix:  Normal appearing without discharge or lesions  Uterus:  Normal in size, shape and contour.  Midline and mobile, nontender  Adnexa/parametria:     Rt: Normal in size, without masses or tenderness.   Lt: Normal in size, without masses or tenderness.  Anus and perineum: Normal  Digital rectal exam: Normal sphincter tone without palpated masses or tenderness  Assessment/Plan:  75 y.o. G1P1001 for breast and pelvic exam.   Well female exam with routine gynecological exam - Education provided on SBEs, importance of preventative screenings, current guidelines, high calcium diet, regular exercise, and multivitamin daily. Labs with PCP.   Senile osteoporosis - Prolia SQ every 6 months with good stabilization. 01/2020 lowest T-score -3.4. Taking daily vitamin D supplement and walking regularly.  Postmenopausal - no HRT, no bleeding.   Screening for cervical cancer - Normal Pap history.  Discussed the option to stop screening per guidelines and she is agreeable. We will reassess on an annual basis.   Screening for breast cancer - Normal mammogram history.  Continue annual screenings.  Normal breast exam today.  Screening for colon cancer - 2019 colonoscopy. Will repeat at 5-year interval per GI's recommended. Brother with history of colon cancer.    Return in 2 years for breast and pelvic exam.    [name] DNP, 9:50 AM 02/16/2021

## 2021-02-16 NOTE — Patient Instructions (Signed)
Health Maintenance After Age 74 After age 74, you are at a higher risk for certain long-term diseases and infections as well as injuries from falls. Falls are a major cause of broken bones and head injuries in people who are older than age 74. Getting regular preventive care can help to keep you healthy and well. Preventive care includes getting regular testing and making lifestyle changes as recommended by your health care provider. Talk with your health care provider about:  Which screenings and tests you should have. A screening is a test that checks for a disease when you have no symptoms.  A diet and exercise plan that is right for you. What should I know about screenings and tests to prevent falls? Screening and testing are the best ways to find a health problem early. Early diagnosis and treatment give you the best chance of managing medical conditions that are common after age 74. Certain conditions and lifestyle choices may make you more likely to have a fall. Your health care provider may recommend:  Regular vision checks. Poor vision and conditions such as cataracts can make you more likely to have a fall. If you wear glasses, make sure to get your prescription updated if your vision changes.  Medicine review. Work with your health care provider to regularly review all of the medicines you are taking, including over-the-counter medicines. Ask your health care provider about any side effects that may make you more likely to have a fall. Tell your health care provider if any medicines that you take make you feel dizzy or sleepy.  Osteoporosis screening. Osteoporosis is a condition that causes the bones to get weaker. This can make the bones weak and cause them to break more easily.  Blood pressure screening. Blood pressure changes and medicines to control blood pressure can make you feel dizzy.  Strength and balance checks. Your health care provider may recommend certain tests to check your  strength and balance while standing, walking, or changing positions.  Foot health exam. Foot pain and numbness, as well as not wearing proper footwear, can make you more likely to have a fall.  Depression screening. You may be more likely to have a fall if you have a fear of falling, feel emotionally low, or feel unable to do activities that you used to do.  Alcohol use screening. Using too much alcohol can affect your balance and may make you more likely to have a fall. What actions can I take to lower my risk of falls? General instructions  Talk with your health care provider about your risks for falling. Tell your health care provider if: ? You fall. Be sure to tell your health care provider about all falls, even ones that seem minor. ? You feel dizzy, sleepy, or off-balance.  Take over-the-counter and prescription medicines only as told by your health care provider. These include any supplements.  Eat a healthy diet and maintain a healthy weight. A healthy diet includes low-fat dairy products, low-fat (lean) meats, and fiber from whole grains, beans, and lots of fruits and vegetables. Home safety  Remove any tripping hazards, such as rugs, cords, and clutter.  Install safety equipment such as grab bars in bathrooms and safety rails on stairs.  Keep rooms and walkways well-lit. Activity  Follow a regular exercise program to stay fit. This will help you maintain your balance. Ask your health care provider what types of exercise are appropriate for you.  If you need a cane or walker,   use it as recommended by your health care provider.  Wear supportive shoes that have nonskid soles.   Lifestyle  Do not drink alcohol if your health care provider tells you not to drink.  If you drink alcohol, limit how much you have: ? 0-1 drink a day for women. ? 0-2 drinks a day for men.  Be aware of how much alcohol is in your drink. In the [place] one drink equals one typical bottle of beer (12  oz), one-half glass of wine (5 oz), or one shot of hard liquor (1 oz).  Do not use any products that contain nicotine or tobacco, such as cigarettes and e-cigarettes. If you need help quitting, ask your health care provider. Summary  Having a healthy lifestyle and getting preventive care can help to protect your health and wellness after age 74.  Screening and testing are the best way to find a health problem early and help you avoid having a fall. Early diagnosis and treatment give you the best chance for managing medical conditions that are more common for people who are older than age 74.  Falls are a major cause of broken bones and head injuries in people who are older than age 74. Take precautions to prevent a fall at home.  Work with your health care provider to learn what changes you can make to improve your health and wellness and to prevent falls. This information is not intended to replace advice given to you by your health care provider. Make sure you discuss any questions you have with your health care provider. Document Revised: 02/06/2019 Document Reviewed: 08/29/2017 Elsevier Patient Education  2021 [employer]  

## 2021-02-25 ENCOUNTER — Other Ambulatory Visit: Payer: Self-pay

## 2021-02-25 ENCOUNTER — Ambulatory Visit (INDEPENDENT_AMBULATORY_CARE_PROVIDER_SITE_OTHER): Payer: Medicare Other | Admitting: Anesthesiology

## 2021-02-25 DIAGNOSIS — M810 Age-related osteoporosis without current pathological fracture: Secondary | ICD-10-CM

## 2021-02-25 MED ORDER — DENOSUMAB 60 MG/ML ~~LOC~~ SOSY
60.0000 mg | PREFILLED_SYRINGE | Freq: Once | SUBCUTANEOUS | Status: AC
  Administered 2021-02-25: 60 mg via SUBCUTANEOUS

## 2021-03-28 ENCOUNTER — Other Ambulatory Visit: Payer: Self-pay | Admitting: Gastroenterology

## 2021-04-03 DIAGNOSIS — Z23 Encounter for immunization: Secondary | ICD-10-CM | POA: Diagnosis not present

## 2021-04-15 IMAGING — CT CT ABD-PELV W/ CM
2 of 5 series · 15 of 46 positions shown, 17 images · IV contrast (iopamidol)
Comparison: 04/09/2009

CLINICAL DATA: Acute pancreatitis, lab abnormalities. Remote tubal
ligation, ovarian cyst removal, and appendectomy

EXAM:
CT ABDOMEN AND PELVIS WITH CONTRAST
TECHNIQUE: Multidetector CT imaging of the abdomen and pelvis was performed
using the standard protocol following bolus administration of
intravenous contrast.
CONTRAST:  100mL HGOSJW-1UU IOPAMIDOL (HGOSJW-1UU) INJECTION 61%

[Series 2: abd pelvis 5.00 br40 s3 axial · axial · 0.64mm/px · z∈[+1089,+1444]mm · 12 of 83 slices shown, 14 images]
[im 6/83  soft-tissue]
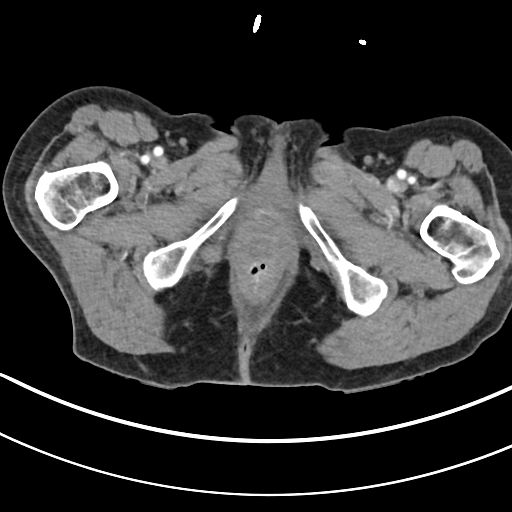
[im 6/83  bone]
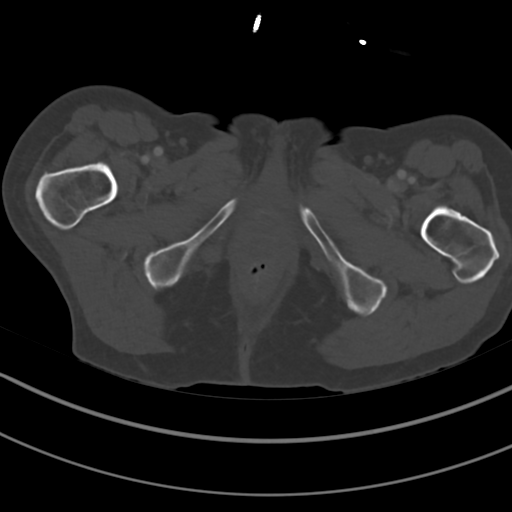
[im 11/83  soft-tissue]
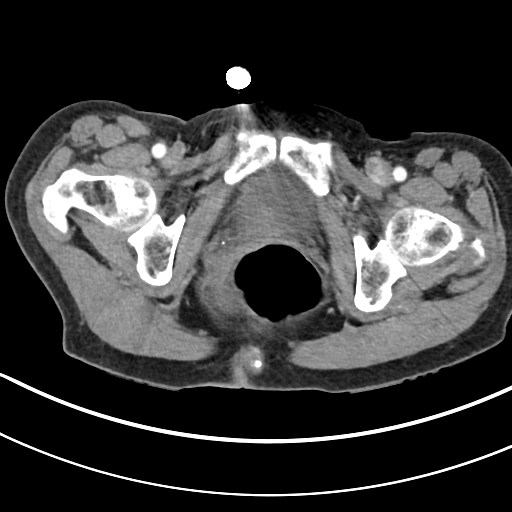
[im 21/83  soft-tissue]
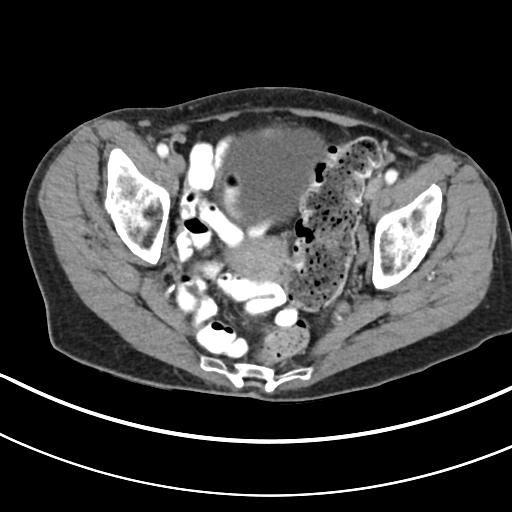
[im 26/83  soft-tissue]
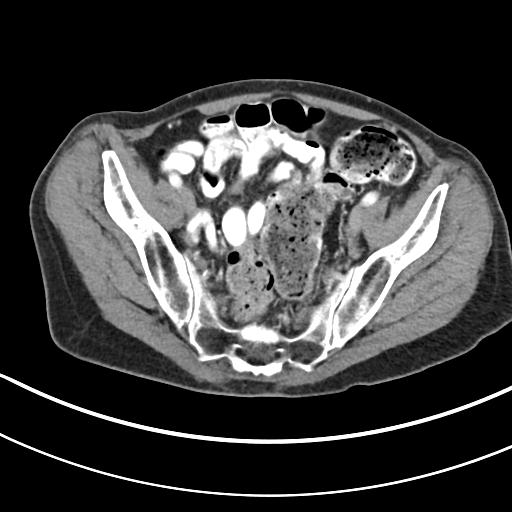
[im 31/83  soft-tissue]
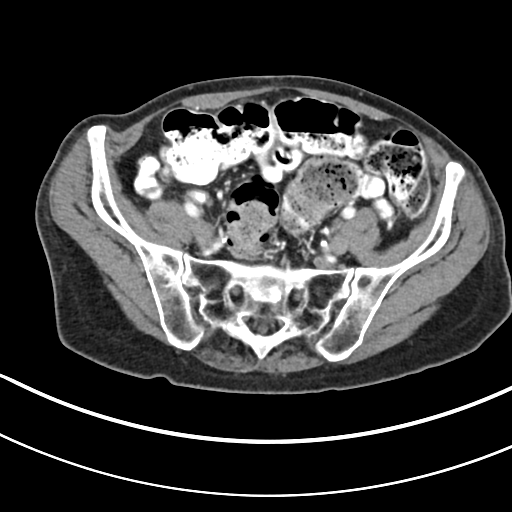
[im 36/83  soft-tissue]
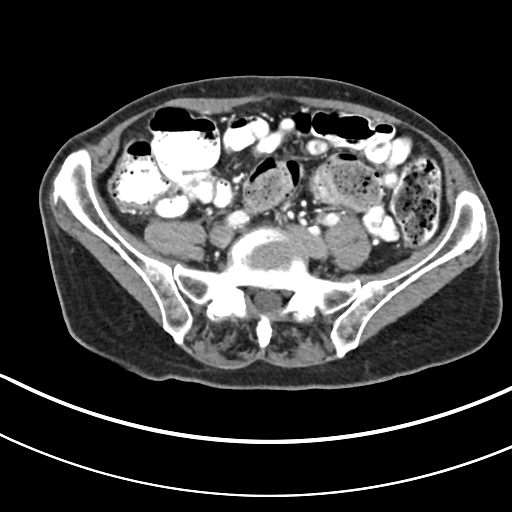
[im 47/83  soft-tissue]
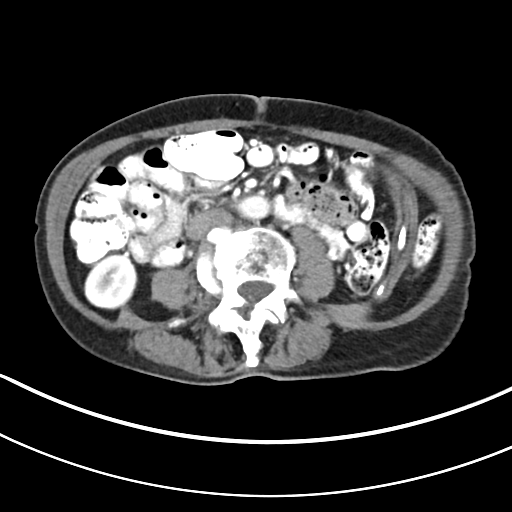
[im 52/83  soft-tissue]
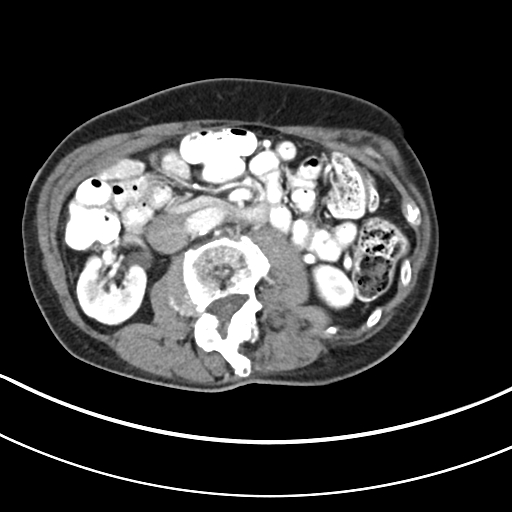
[im 57/83  soft-tissue]
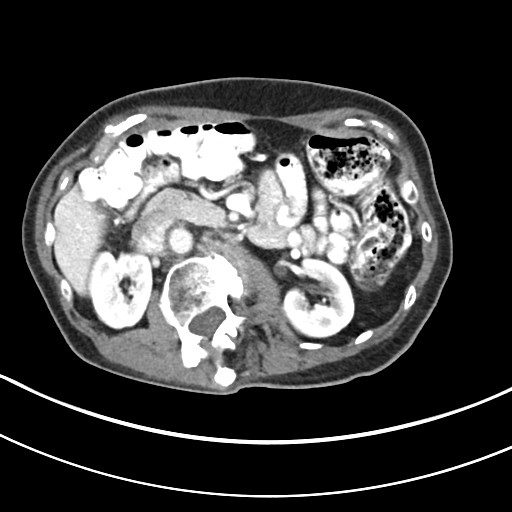
[im 57/83  bone]
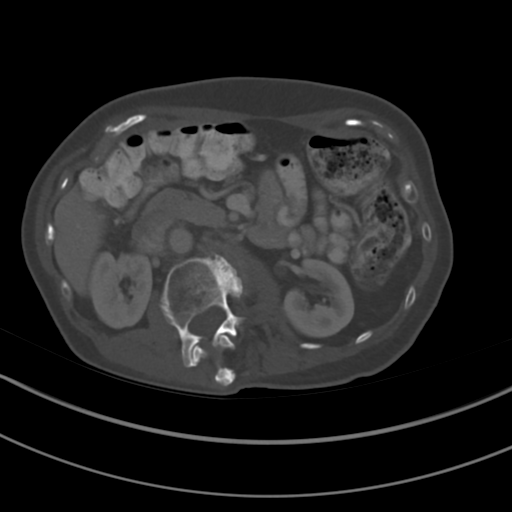
[im 62/83  soft-tissue]
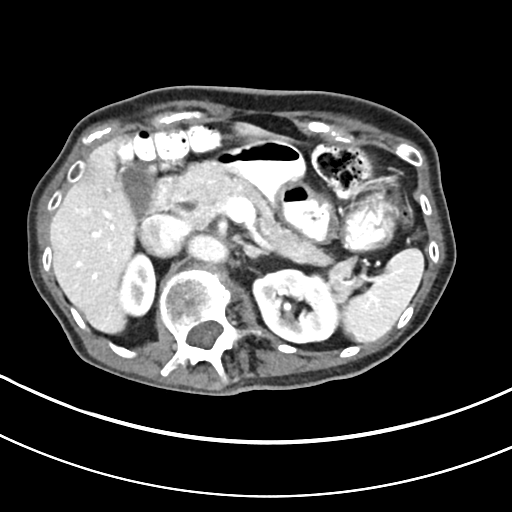
[im 72/83  soft-tissue]
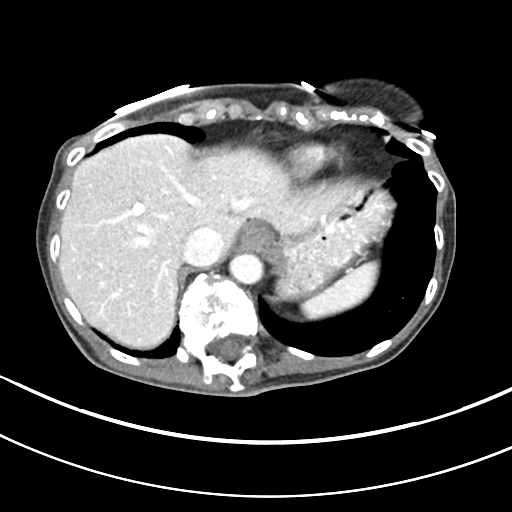
[im 77/83  soft-tissue]
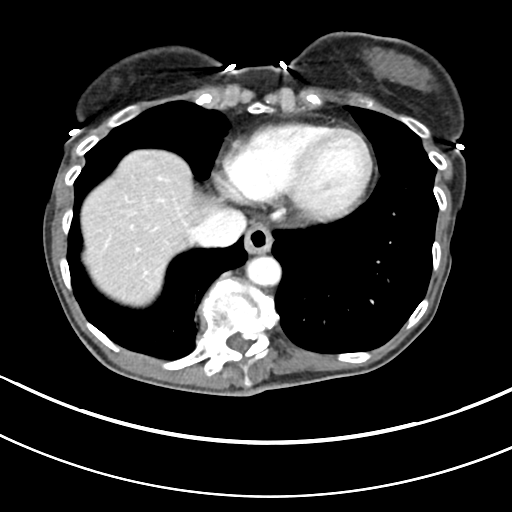

[Series 6: abd pelvis 2.00 br40 s3 cor · coronal · 0.64mm/px · 3 of 165 slices shown]
[im 55/165  soft-tissue]
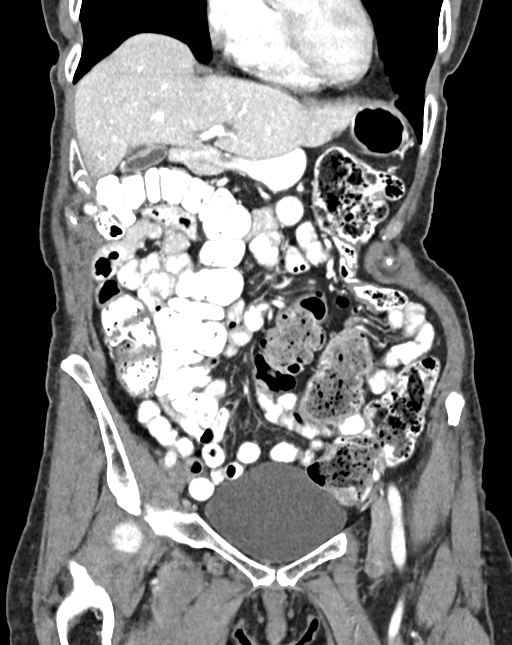
[im 73/165  soft-tissue]
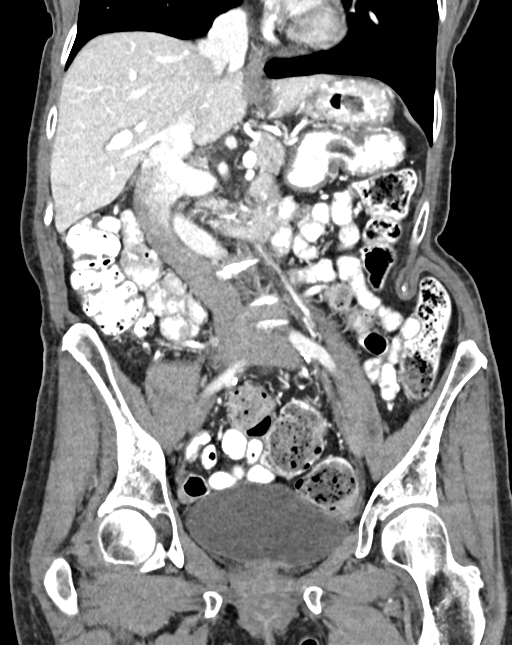
[im 92/165  soft-tissue]
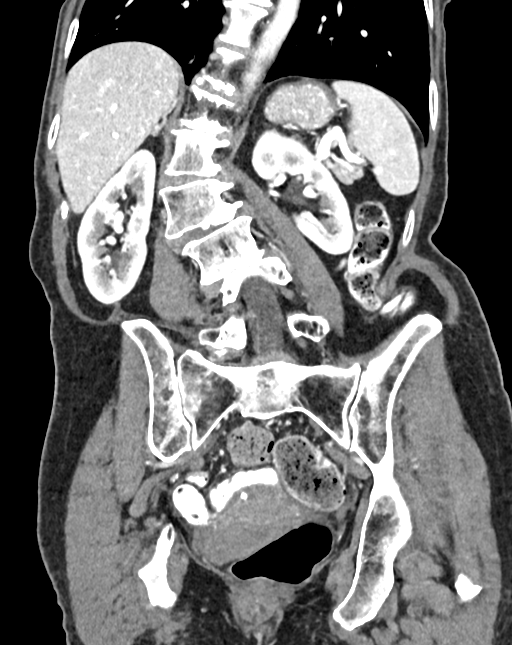

[15 of 46 positions shown; findings below may reference images not displayed]

FINDINGS: Lower chest: Minor right base dependent atelectasis. Normal heart
size. No pericardial or pleural effusion. Circumferential wall
thickening of the distal esophagus, nonspecific but can be seen with
esophagitis.

Hepatobiliary: Subcentimeter left hepatic hypodensity, image 20
series 2 suspect small hepatic cyst. No other significant hepatic
abnormality or biliary dilatation. Hepatic and portal veins are
patent. Gallbladder unremarkable. Common bile duct nondistended.

Pancreas: Unremarkable. No pancreatic ductal dilatation or
surrounding inflammatory changes.

Spleen: Normal in size without focal abnormality.

Adrenals/Urinary Tract: Adrenal glands are unremarkable. Kidneys are
normal, without renal calculi, focal lesion, or hydronephrosis.
Bladder is unremarkable.

Stomach/Bowel: Negative for bowel obstruction, significant
dilatation, ileus, or free air. Appendix not visualized. Large
colonic stool burden distally. No acute inflammatory process or wall
thickening.

No free fluid, fluid collection, hemorrhage, hematoma, or abscess.

Vascular/Lymphatic: Aortic tortuosity and atherosclerosis. Negative
for aneurysm or acute vascular process. No occlusive disease.
Mesenteric and renal vasculature remain patent. No bulky adenopathy.

Reproductive: Uterus and adnexa normal in size. No pelvic free
fluid.

Other: No abdominal wall hernia or abnormality. No abdominopelvic
ascites.

Musculoskeletal: Degenerative changes of the spine with similar
degree of chronic scoliosis. No acute osseous finding.
IMPRESSION: Negative for significant acute pancreatitis by CT.

Circumferential distal esophageal wall thickening, nonspecific but
suggesting esophagitis.

Large distal colonic stool burden can be seen with constipation.

Aortic Atherosclerosis (JPIR4-T44.4).

## 2021-05-17 ENCOUNTER — Encounter: Payer: Self-pay | Admitting: Emergency Medicine

## 2021-05-17 ENCOUNTER — Other Ambulatory Visit: Payer: Self-pay

## 2021-05-17 ENCOUNTER — Ambulatory Visit
Admission: EM | Admit: 2021-05-17 | Discharge: 2021-05-17 | Disposition: A | Discharge status: Home / Self Care | Payer: Medicare Other | Attending: Urgent Care | Admitting: Urgent Care

## 2021-05-17 DIAGNOSIS — H6122 Impacted cerumen, left ear: Secondary | ICD-10-CM

## 2021-05-17 MED ORDER — CARBAMIDE PEROXIDE 6.5 % OT SOLN: 5.0000 [drp] | Freq: Two times a day (BID) | OTIC | 0 refills | Status: DC | PRN

## 2021-05-17 NOTE — ED Provider Notes (Signed)
[place]   MRN: [id number] DOB: [birth year]  Subjective:   Sheryl Huang is a 75 y.o. female presenting for 80-month history of persistent intermittent bilateral ear fullness.  This particular episode seems very bothersome on the left side.  Denies history of persistent trouble with earwax buildup.  Denies fever, runny or stuffy nose, sore throat, cough, ear pain, ear drainage.  She does use Q-tips inside of her ears intermittently.  Has not used any medications for relief.  No current facility-administered medications for this encounter.  Current Outpatient Medications:    pantoprazole (PROTONIX) 40 MG tablet, TAKE 1 TABLET BY MOUTH EVERY DAY, Disp: 30 tablet, Rfl: 3   Calcium Carbonate-Vitamin D (CALCIUM 600 + D PO), Take 1 tablet by mouth 2 (two) times daily., Disp: , Rfl:    cholecalciferol (VITAMIN D) 1000 UNITS tablet, Take 1,000 Units by mouth daily., Disp: , Rfl:    denosumab (PROLIA) 60 MG/ML SOLN injection, Inject 60 mg into the skin every 6 (six) months. Administer in upper arm, thigh, or abdomen, Disp: , Rfl:    escitalopram (LEXAPRO) 10 MG tablet, TAKE 1 TABLET BY MOUTH EVERY DAY (Patient not taking: No sig reported), Disp: 30 tablet, Rfl: 3   fluticasone (CUTIVATE) 0.05 % cream, as needed.  (Patient not taking: No sig reported), Disp: , Rfl:    POTASSIUM PO, Take 1 tablet by mouth daily. , Disp: , Rfl:    Allergies  Allergen Reactions   Erythromycin     REACTION: SEVERE GI UPSET    Past Medical History:  Diagnosis Date   Allergy    Anemia    Cataract    removed both eyes   GERD (gastroesophageal reflux disease)    History of shingles    Osteoporosis 11/2016   T score 3.8   Postmenopausal 02/11/2020     Past Surgical History:  Procedure Laterality Date   APPENDECTOMY  1969   BREAST BIOPSY  2001   left and right   CATARACT EXTRACTION, BILATERAL     COLONOSCOPY  2014   neg   OVARIAN CYST REMOVAL  1995   TUBAL LIGATION  1992   UPPER GI  ENDOSCOPY      Family History  Problem Relation Age of Onset   Hypertension Mother    Stroke Mother 22   Hypertension Father    Heart attack Father    Colon cancer Brother    Heart attack Paternal Grandmother        ? age   Heart attack Maternal Uncle    Coronary artery disease Paternal Aunt    Coronary artery disease Paternal Uncle    Heart attack Maternal Grandfather    Diabetes Neg Hx    Colon polyps Neg Hx    Esophageal cancer Neg Hx    Rectal cancer Neg Hx    Stomach cancer Neg Hx     Social History   Tobacco Use   Smoking status: Never   Smokeless tobacco: Never  Vaping Use   Vaping Use: Never used  Substance Use Topics   Alcohol use: No    Alcohol/week: 0.0 standard drinks   Drug use: No    ROS   Objective:   Vitals: BP 100/62 (BP Location: Right Arm)   Pulse 76   Temp 98.8 F (37.1 C) (Oral)   Resp 14   SpO2 94%   Physical Exam Constitutional:      General: She is not in acute distress.    Appearance:  Normal appearance. She is well-developed. She is not ill-appearing, toxic-appearing or diaphoretic.  HENT:     Head: Normocephalic and atraumatic.     Right Ear: Tympanic membrane, ear canal and external ear normal. There is no impacted cerumen.     Left Ear: There is impacted cerumen.     Nose: Nose normal.     Mouth/Throat:     Mouth: Mucous membranes are moist.     Pharynx: Oropharynx is clear.  Eyes:     General: No scleral icterus.    Extraocular Movements: Extraocular movements intact.     Pupils: Pupils are equal, round, and reactive to light.  Cardiovascular:     Rate and Rhythm: Normal rate.  Pulmonary:     Effort: Pulmonary effort is normal.  Skin:    General: Skin is warm and dry.  Neurological:     General: No focal deficit present.     Mental Status: She is alert and oriented to person, place, and time.  Psychiatric:        Mood and Affect: Mood normal.        Behavior: Behavior normal.    Ear lavage performed using  mixture of peroxide and water.  Pressure irrigation performed using a bottle and a thin ear tube.  Left ear lavage including use of a curette for manual removal.   Assessment and Plan :   PDMP not reviewed this encounter.  1. Impacted cerumen of left ear     Successful left ear lavage.  General management of cerumen impaction reviewed with patient.  Anticipatory guidance provided. Counseled patient on potential for adverse effects with medications prescribed/recommended today, ER and return-to-clinic precautions discussed, patient verbalized understanding.    Jaynee Eagles, [place] 05/17/21 [phone]

## 2021-05-17 NOTE — ED Triage Notes (Signed)
Patient c/o bilateral ear fullness x 2 months.   Patient states " it seems like it will clear up and then it comes back again".   Patient endorses increased fullness of LFT ear and endorses ear pain on LFT side.   Patient endorses decreased hearing with LFT ear.   Patient hasn't used any medications for symptoms.

## 2021-05-19 DIAGNOSIS — Z1231 Encounter for screening mammogram for malignant neoplasm of breast: Secondary | ICD-10-CM | POA: Diagnosis not present

## 2021-07-27 ENCOUNTER — Other Ambulatory Visit: Payer: Self-pay | Admitting: Gastroenterology

## 2021-07-27 ENCOUNTER — Telehealth: Payer: Self-pay | Admitting: *Deleted

## 2021-07-27 DIAGNOSIS — M810 Age-related osteoporosis without current pathological fracture: Secondary | ICD-10-CM

## 2021-07-27 NOTE — Telephone Encounter (Signed)
PROLIA GIVEN 02/25/2021 NEXT INJECTION 08/28/2021

## 2021-07-27 NOTE — Telephone Encounter (Addendum)
Deductible [id number] [phone] met)  OOP MAX n/a  Annual exam 02/16/2021  Calcium  9.5           Date 08/01/2021  Upcoming dental procedures NO  Prior Authorization needed NO   Pt estimated Cost $0   08/29/2021   Coverage Details: $0 ONE DOSE, $0 ADMIN FEE

## 2021-07-27 NOTE — Telephone Encounter (Signed)
Prolia insurance verification has been sent awaiting Summary of benefits  

## 2021-08-01 ENCOUNTER — Other Ambulatory Visit (INDEPENDENT_AMBULATORY_CARE_PROVIDER_SITE_OTHER): Payer: Medicare Other

## 2021-08-01 ENCOUNTER — Other Ambulatory Visit: Payer: Self-pay

## 2021-08-01 DIAGNOSIS — M810 Age-related osteoporosis without current pathological fracture: Secondary | ICD-10-CM

## 2021-08-02 LAB — CALCIUM: Calcium: 9.5 mg/dL (ref 8.6–10.4)

## 2021-08-10 DIAGNOSIS — Z23 Encounter for immunization: Secondary | ICD-10-CM | POA: Diagnosis not present

## 2021-08-29 ENCOUNTER — Ambulatory Visit (INDEPENDENT_AMBULATORY_CARE_PROVIDER_SITE_OTHER): Payer: Medicare Other

## 2021-08-29 ENCOUNTER — Other Ambulatory Visit: Payer: Self-pay

## 2021-08-29 DIAGNOSIS — M810 Age-related osteoporosis without current pathological fracture: Secondary | ICD-10-CM

## 2021-08-29 MED ORDER — DENOSUMAB 60 MG/ML ~~LOC~~ SOSY
60.0000 mg | PREFILLED_SYRINGE | Freq: Once | SUBCUTANEOUS | Status: AC
  Administered 2021-08-29: 60 mg via SUBCUTANEOUS

## 2021-08-29 NOTE — Progress Notes (Signed)
Patient in today for seventh Prolia injection. Patient's initial calcium level was obtained on 08/01/21.  Result: 9.5.  Last AEX: 02/16/21 Last BMD: 4/217/21   Injection given in right arm.  Patient tolerated injection well.  Routed to provider for review.

## 2021-09-14 NOTE — Telephone Encounter (Signed)
PROLIA GIVEN 08/29/2021 NEXT INJECTION 02/27/2022

## 2021-11-01 DIAGNOSIS — Z23 Encounter for immunization: Secondary | ICD-10-CM | POA: Diagnosis not present

## 2021-12-07 ENCOUNTER — Other Ambulatory Visit: Payer: Self-pay | Admitting: Gastroenterology

## 2022-01-18 ENCOUNTER — Telehealth: Payer: Self-pay | Admitting: Family Medicine

## 2022-01-18 NOTE — Telephone Encounter (Signed)
Left message for patient to call back and schedule Medicare Annual Wellness Visit (AWV) and appointment with Dr.Pickard. Patient's last office visit was 03/16/20. ? ?If not able to come in office, please offer to do virtually or by telephone.  Left office number and my jabber [phone]. ? ?Last AWV:09/14/2017 ? ?Please schedule at anytime with Nurse Health Advisor. ?  ?

## 2022-02-21 ENCOUNTER — Telehealth: Payer: Self-pay | Admitting: *Deleted

## 2022-02-21 NOTE — Telephone Encounter (Addendum)
Deductible $226 ($0 met)  OOP MAX n/a  Annual exam 03/06/2022  Calcium   9.5          Date 08/01/2021  Upcoming dental procedures no  Prior Authorization needed NO  Pt estimated Cost $0   Appt 03/08/2022    Coverage Details:: This is a Medicare Supplement Plan F and it covers the Medicare Part B deductible, co-insurance and 100% of the excess charges

## 2022-03-06 ENCOUNTER — Ambulatory Visit (INDEPENDENT_AMBULATORY_CARE_PROVIDER_SITE_OTHER): Payer: Medicare Other | Admitting: Nurse Practitioner

## 2022-03-06 ENCOUNTER — Encounter: Payer: Self-pay | Admitting: Nurse Practitioner

## 2022-03-06 VITALS — BP 120/60 | Ht 65.0 in | Wt 109.0 lb

## 2022-03-06 DIAGNOSIS — Z780 Asymptomatic menopausal state: Secondary | ICD-10-CM

## 2022-03-06 DIAGNOSIS — Z01419 Encounter for gynecological examination (general) (routine) without abnormal findings: Secondary | ICD-10-CM | POA: Diagnosis not present

## 2022-03-06 DIAGNOSIS — M810 Age-related osteoporosis without current pathological fracture: Secondary | ICD-10-CM | POA: Diagnosis not present

## 2022-03-06 MED ORDER — DENOSUMAB 60 MG/ML ~~LOC~~ SOSY
60.0000 mg | PREFILLED_SYRINGE | Freq: Once | SUBCUTANEOUS | Status: AC
  Administered 2022-03-06: 60 mg via SUBCUTANEOUS

## 2022-03-06 NOTE — Progress Notes (Signed)
? ?  Sheryl Huang May 26, 1946 [id number] ? ? ?History:  76 y.o. G1P1001 presents for breast and pelvic exam. No GYN complaints. Postmenopausal - no HRT, no bleeding. Osteoporosis, on Prolia. Normal pap and mammogram history.  ? ?Gynecologic History ?No LMP recorded. Patient is postmenopausal. ?  ?Contraception: post menopausal status ?Sexually active: No ? ?Health Maintenance ?Last Pap: 11/27/2016. Results were: Normal ?Last mammogram: 05/19/2021. Results were: Normal ?Last colonoscopy: 07/10/2018. Results: Benign polyps, 5-year recall ?Last Dexa: 02/24/2020. Results were: T-score -3.4 ? ?Past medical history, past surgical history, family history and social history were all reviewed and documented in the EPIC chart. Brother with history of colon cancer. Widowed. Daughter in [place], 50 yo grandson diagnosed with T1DM at age 48. and 60 yo granddaughter. Active in church, exercises often.  ? ?ROS:  A ROS was performed and pertinent positives and negatives are included. ? ?Exam: ? ?Vitals:  ? 03/06/22 1047  ?BP: 120/60  ?Weight: 109 lb (49.4 kg)  ?Height: '5\' 5"'$  (1.651 m)  ? ? ?Body mass index is 18.14 kg/m?. ? ?General appearance:  Normal ?Thyroid:  Symmetrical, normal in size, without palpable masses or nodularity. ?Respiratory ? Auscultation:  Clear without wheezing or rhonchi ?Cardiovascular ? Auscultation:  Regular rate, without rubs, murmurs or gallops ? Edema/varicosities:  Not grossly evident ?Abdominal ? Soft,nontender, without masses, guarding or rebound. ? Liver/spleen:  No organomegaly noted ? Hernia:  None appreciated ? Skin ? Inspection:  Grossly normal ?Breasts: Examined lying and sitting.  ? Right: Without masses, retractions, nipple discharge or axillary adenopathy. ? ? Left: Without masses, retractions, nipple discharge or axillary adenopathy. ?Gentitourinary  ? Inguinal/mons:  Normal without inguinal adenopathy ? External genitalia:  Normal appearing vulva with no masses, tenderness, or  lesions ? BUS/Urethra/Skene's glands:  Normal ? Vagina:  Normal appearing with normal color and discharge, no lesions. Atrophic changes ? Cervix:  Normal appearing without discharge or lesions ? Uterus:  Normal in size, shape and contour.  Midline and mobile, nontender ? Adnexa/parametria:   ?  Rt: Normal in size, without masses or tenderness. ?  Lt: Normal in size, without masses or tenderness. ? Anus and perineum: Normal ? Digital rectal exam: Normal sphincter tone without palpated masses or tenderness ? ?Patient informed chaperone available to be present for breast and pelvic exam. Patient has requested no chaperone to be present. Patient has been advised what will be completed during breast and pelvic exam.  ? ?Assessment/Plan:  76 y.o. G1P1001 for breast and pelvic exam.  ? ?Well female exam with routine gynecological exam - Education provided on SBEs, importance of preventative screenings, current guidelines, high calcium diet, regular exercise, and multivitamin daily. Labs with PCP.  ? ?Age-related osteoporosis without current pathological fracture - Plan: DG Bone Density ? - Prolia SQ every 6 months, received today. 01/2020 T-score -3.4, stable. Taking daily vitamin D supplement, exercising (classes) and walking regularly. Will schedule DXA now.  ? ?Postmenopausal - no HRT, no bleeding.  ? ?Screening for cervical cancer - Normal Pap history. No longer screening per guidelines.  ? ?Screening for breast cancer - Normal mammogram history.  Continue annual screenings.  Normal breast exam today. ? ?Screening for colon cancer - 2019 colonoscopy. Will repeat at 5-year interval per GI's recommended. Brother with history of colon cancer.   ? ?Return in 2 years for breast and pelvic exam.  ? ? ?[name] DNP, 11:10 AM 03/06/2022 ? ?

## 2022-03-06 NOTE — Addendum Note (Signed)
Addended by: Nelva Nay on: 03/06/2022 11:35 AM ? ? Modules accepted: Orders ? ?

## 2022-03-29 ENCOUNTER — Ambulatory Visit (INDEPENDENT_AMBULATORY_CARE_PROVIDER_SITE_OTHER): Payer: Medicare Other

## 2022-03-29 ENCOUNTER — Other Ambulatory Visit: Payer: Self-pay | Admitting: Nurse Practitioner

## 2022-03-29 DIAGNOSIS — M810 Age-related osteoporosis without current pathological fracture: Secondary | ICD-10-CM | POA: Diagnosis not present

## 2022-03-29 DIAGNOSIS — Z780 Asymptomatic menopausal state: Secondary | ICD-10-CM

## 2022-03-29 DIAGNOSIS — Z13820 Encounter for screening for osteoporosis: Secondary | ICD-10-CM | POA: Diagnosis not present

## 2022-04-17 ENCOUNTER — Other Ambulatory Visit: Payer: Self-pay | Admitting: Gastroenterology

## 2022-05-08 ENCOUNTER — Ambulatory Visit (INDEPENDENT_AMBULATORY_CARE_PROVIDER_SITE_OTHER): Payer: Medicare Other | Admitting: Obstetrics & Gynecology

## 2022-05-08 ENCOUNTER — Encounter: Payer: Self-pay | Admitting: Obstetrics & Gynecology

## 2022-05-08 VITALS — BP 92/70 | HR 73 | Resp 95

## 2022-05-08 DIAGNOSIS — M810 Age-related osteoporosis without current pathological fracture: Secondary | ICD-10-CM

## 2022-05-08 DIAGNOSIS — Z780 Asymptomatic menopausal state: Secondary | ICD-10-CM

## 2022-05-08 NOTE — Progress Notes (Signed)
    Sheryl Huang [birth year] [id number]        76 y.o.  G1P1L1   RP: Counseling and management of Osteoporosis  HPI: Osteoporosis on Prolia x 2018.  Previously on Fosamax and Actonel for > 5 years. Taking 2 Calcium tab of 600 mg each daily and Vit D 1000 IU daily.  Has increased her daily walks and does chair exercises.  No h/o or current fracture.  Good balance.  Mother with Osteoporosis.   OB History  Gravida Para Term Preterm AB Living  1 1 1     1   SAB IAB Ectopic Multiple Live Births               # Outcome Date GA Lbr Len/2nd Weight Sex Delivery Anes PTL Lv  1 Term             Past medical history,surgical history, problem list, medications, allergies, family history and social history were all reviewed and documented in the EPIC chart.   Directed ROS with pertinent positives and negatives documented in the history of present illness/assessment and plan.  Exam:  Vitals:   05/08/22 0922  BP: 92/70  Pulse: 73  Resp: (!) 95   General appearance:  Normal  Bone Density 03/29/2022: Osteoporosis at the Left 1/3 Forearm T-Score -3.7 and at the Left Femoral Neck T-Score -2.9.  Bone Density stable at both Total Hips.  Decreased BD at the Lt 1/3 Forearm.   Assessment/Plan:  76 y.o. G1P1001   1. Age-related osteoporosis without current pathological fracture Osteoporosis on Prolia x 2018.  Previously on Fosamax and Actonel for > 5 years. Taking 2 Calcium tab of 600 mg each daily and Vit D 1000 IU daily.  Has increased her daily walks and does chair exercises.  No h/o or current fracture.  Good balance.  Mother with Osteoporosis.  Bone Density 03/29/2022 reviewed thoroughly with patient.  Given the stability of the BD at both Total Hips, decision to continue on Prolia.  Will repeat a BD at 2 years.  Encouraged to increase her weight bearing physical activities and start upper body weight lifting.  Continue Ca++ and Vit D supplements.  2. Postmenopausal  Well on no  HRT.  Counseling and management of osteoporosis on Prolia for 25 minutes.  Genia Del MD, 10:07 AM 05/08/2022

## 2022-05-22 DIAGNOSIS — Z1231 Encounter for screening mammogram for malignant neoplasm of breast: Secondary | ICD-10-CM | POA: Diagnosis not present

## 2022-05-24 ENCOUNTER — Encounter: Payer: Self-pay | Admitting: Nurse Practitioner

## 2022-05-26 ENCOUNTER — Telehealth: Payer: Self-pay | Admitting: *Deleted

## 2022-05-26 NOTE — Telephone Encounter (Signed)
Patient scheduled at [facility] on 05/30/22 @ 9:15 am at [facility] already order faxed. Patient informed me of this date and time.

## 2022-05-26 NOTE — Telephone Encounter (Signed)
-----   Message from [name], NP sent at 05/26/2022 11:36 AM EDT ----- Regarding: Diagnostic mammogram Please send referral for diagnostic mammogram for right breast pain.

## 2022-05-30 ENCOUNTER — Encounter: Payer: Self-pay | Admitting: Nurse Practitioner

## 2022-05-30 DIAGNOSIS — N6489 Other specified disorders of breast: Secondary | ICD-10-CM | POA: Diagnosis not present

## 2022-05-30 DIAGNOSIS — R928 Other abnormal and inconclusive findings on diagnostic imaging of breast: Secondary | ICD-10-CM | POA: Diagnosis not present

## 2022-05-30 DIAGNOSIS — R922 Inconclusive mammogram: Secondary | ICD-10-CM | POA: Diagnosis not present

## 2022-07-25 DIAGNOSIS — Z961 Presence of intraocular lens: Secondary | ICD-10-CM | POA: Diagnosis not present

## 2022-07-25 DIAGNOSIS — H18513 Endothelial corneal dystrophy, bilateral: Secondary | ICD-10-CM | POA: Diagnosis not present

## 2022-07-25 DIAGNOSIS — H40013 Open angle with borderline findings, low risk, bilateral: Secondary | ICD-10-CM | POA: Diagnosis not present

## 2022-08-05 ENCOUNTER — Telehealth (HOSPITAL_COMMUNITY): Payer: Self-pay

## 2022-08-05 ENCOUNTER — Ambulatory Visit (HOSPITAL_COMMUNITY)
Admission: EM | Admit: 2022-08-05 | Discharge: 2022-08-05 | Disposition: A | Discharge status: Home / Self Care | Payer: Medicare Other | Attending: Nurse Practitioner | Admitting: Nurse Practitioner

## 2022-08-05 ENCOUNTER — Other Ambulatory Visit: Payer: Self-pay

## 2022-08-05 ENCOUNTER — Encounter (HOSPITAL_COMMUNITY): Payer: Self-pay | Admitting: *Deleted

## 2022-08-05 DIAGNOSIS — H6122 Impacted cerumen, left ear: Secondary | ICD-10-CM

## 2022-08-05 MED ORDER — OFLOXACIN 0.3 % OT SOLN: 10.0000 [drp] | Freq: Every day | OTIC | 0 refills | Status: AC

## 2022-08-05 NOTE — Discharge Instructions (Signed)
We were not able to fully remove the earwax from your ear today.   Please start on the eardrops to help prevent infection in the skin in your ear.    Please follow-up with the ear nose and throat doctor early next week for earwax removal.  Contact information is below.

## 2022-08-05 NOTE — ED Notes (Signed)
Era irrigation stopped because Pt reported pain while irrigating.

## 2022-08-05 NOTE — ED Triage Notes (Signed)
Pt reports Lt ear is full. Pt has tried OTC gtts with out relief. Pt reports wax build up in same ear.

## 2022-08-05 NOTE — ED Provider Notes (Signed)
[place]    CSN: [id number] Arrival date & time: 08/05/22  1347      History   Chief Complaint Chief Complaint  Patient presents with   Ear Fullness    HPI Sheryl Huang is a 76 y.o. female.   Patient presents for left ear fullness/irritation that has been going on the past few weeks.  She endorses sensation of feeling clogged/plugged, decreased hearing for the past day.  No ear pain, recent fever, or ear drainage.  No recent water immersion or water sports.  Reports she does have little bit of a scratchy throat and slight cough because of the season change in her allergies.  Denies use of Q-tips.  Does have a history of ceruminosis in the left ear.  Has tried over-the-counter Debrox drops without relief.     Past Medical History:  Diagnosis Date   Allergy    Anemia    Cataract    removed both eyes   GERD (gastroesophageal reflux disease)    History of shingles    Osteoporosis 11/2016   T score 3.8   Postmenopausal 02/11/2020    Patient Active Problem List   Diagnosis Date Noted   Postmenopausal 02/11/2020   Routine general medical examination at a health care facility 10/09/2015   HYPERLIPIDEMIA 09/13/2010   Senile osteoporosis 04/09/2009   SCOLIOSIS 04/09/2009    Past Surgical History:  Procedure Laterality Date   APPENDECTOMY  1969   BREAST BIOPSY  2001   left and right   CATARACT EXTRACTION, BILATERAL     COLONOSCOPY  2014   neg   OVARIAN CYST REMOVAL  1995   TUBAL LIGATION  1992   UPPER GI ENDOSCOPY      OB History     Gravida  1   Para  1   Term  1   Preterm      AB      Living  1      SAB      IAB      Ectopic      Multiple      Live Births               Home Medications    Prior to Admission medications   Medication Sig Start Date End Date Taking? Authorizing Provider  ofloxacin (FLOXIN) 0.3 % OTIC solution Place 10 drops into the left ear daily for 7 days. 08/05/22 08/12/22 Yes Eulogio Bear, NP   Calcium Carbonate-Vitamin D (CALCIUM 600 + D PO) Take 1 tablet by mouth 2 (two) times daily.    [provider]  cholecalciferol (VITAMIN D) 1000 UNITS tablet Take 1,000 Units by mouth daily.    [provider]  denosumab (PROLIA) 60 MG/ML SOLN injection Inject 60 mg into the skin every 6 (six) months. Administer in upper arm, thigh, or abdomen    [provider]  fluticasone (CUTIVATE) 0.05 % cream as needed.    [provider]  pantoprazole (PROTONIX) 40 MG tablet TAKE 1 TABLET BY MOUTH EVERY DAY 04/17/22   Mauri Pole, MD  POTASSIUM PO Take 1 tablet by mouth daily.     [provider]    Family History Family History  Problem Relation Age of Onset   Hypertension Mother    Stroke Mother 50   Hypertension Father    Heart attack Father    Colon cancer Brother    Heart attack Paternal Grandmother        ?  age   Heart attack Maternal Uncle    Coronary artery disease Paternal Aunt    Coronary artery disease Paternal Uncle    Heart attack Maternal Grandfather    Diabetes Neg Hx    Colon polyps Neg Hx    Esophageal cancer Neg Hx    Rectal cancer Neg Hx    Stomach cancer Neg Hx     Social History Social History   Tobacco Use   Smoking status: Never   Smokeless tobacco: Never  Vaping Use   Vaping Use: Never used  Substance Use Topics   Alcohol use: No    Alcohol/week: 0.0 standard drinks of alcohol   Drug use: No     Allergies   Erythromycin   Review of Systems Review of Systems Per HPI  Physical Exam Triage Vital Signs ED Triage Vitals  Enc Vitals Group     BP 08/05/22 1424 (!) 115/52     Pulse Rate 08/05/22 1424 80     Resp 08/05/22 1424 18     Temp 08/05/22 1424 98 F (36.7 C)     Temp src --      SpO2 08/05/22 1424 97 %     Weight --      Height --      Head Circumference --      Peak Flow --      Pain Score 08/05/22 1423 0     Pain Loc --      Pain Edu? --      Excl. in [place]? --    No data  found.  Updated Vital Signs BP (!) 115/52   Pulse 80   Temp 98 F (36.7 C)   Resp 18   SpO2 97%   Visual Acuity Right Eye Distance:   Left Eye Distance:   Bilateral Distance:    Right Eye Near:   Left Eye Near:    Bilateral Near:     Physical Exam Vitals and nursing note reviewed.  Constitutional:      General: She is not in acute distress.    Appearance: Normal appearance. She is not toxic-appearing.  HENT:     Head: Normocephalic and atraumatic.     Right Ear: Tympanic membrane, ear canal and external ear normal. There is no impacted cerumen.     Left Ear: There is impacted cerumen.     Nose: Nose normal. No congestion.     Mouth/Throat:     Mouth: Mucous membranes are moist.     Pharynx: Oropharynx is clear.  Pulmonary:     Effort: Pulmonary effort is normal. No respiratory distress.  Skin:    General: Skin is warm and dry.     Capillary Refill: Capillary refill takes less than 2 seconds.     Coloration: Skin is not jaundiced or pale.     Findings: No erythema or rash.  Neurological:     Mental Status: She is alert and oriented to person, place, and time.  Psychiatric:        Behavior: Behavior is cooperative.      UC Treatments / Results  Labs (all labs ordered are listed, but only abnormal results are displayed) Labs Reviewed - No data to display  EKG Her baseline.  Radiology No results found.  Procedures Ear Cerumen Removal  Date/Time: 08/05/2022 3:53 PM  Performed by: Eulogio Bear, NP Authorized by: Eulogio Bear, NP   Consent:    Consent obtained:  Verbal   Consent given  by:  Patient   Risks, benefits, and alternatives were discussed: yes     Risks discussed:  Bleeding, infection, pain, TM perforation, incomplete removal and dizziness   Alternatives discussed:  Delayed treatment Universal protocol:    Procedure explained and questions answered to patient or proxy's satisfaction: yes     Patient identity confirmed:  Verbally  with patient Procedure details:    Location:  L ear   Procedure type: irrigation     Procedure outcomes: unable to remove cerumen   Post-procedure details:    Inspection:  Bleeding, macerated skin and some cerumen remaining   Post-procedure hearing quality: unchanged from hearing prior to cerumen removal.   Procedure completion:  Procedure terminated due to patient's clinical status  (including critical care time)  Medications Ordered in UC Medications - No data to display  Initial Impression / Assessment and Plan / UC Course  I have reviewed the triage vital signs and the nursing notes.  Pertinent labs & imaging results that were available during my care of the patient were reviewed by me and considered in my medical decision making (see chart for details).    Patient is well-appearing, afebrile, not tachycardic, not tachypneic, oxygenating well on room air.  She is slightly hypotensive, however this appears to be her baseline.  On examination, she has impacted cerumen in the left ear.  We attempted ear lavage which was not successful and procedure was stopped secondary to pain.  Upon reexamination, there was bleeding in her external auditory canal.  I do not suspect a perforated tympanic membrane, instead there appears to be a laceration in her external auditory canal that is oozing blood.  I attempted to use a curette to remove the rest of the cerumen, however was unable to do so.  We will start ofloxacin drops to cover for otitis externa and recommended close follow-up with ear nose and throat in the next week.  ER and return precautions discussed.  The patient was given the opportunity to ask questions.  All questions answered to their satisfaction.  The patient is in agreement to this plan.    Final Clinical Impressions(s) / UC Diagnoses   Final diagnoses:  Ceruminosis, left     Discharge Instructions      We were not able to fully remove the earwax from your ear today.   Please  start on the eardrops to help prevent infection in the skin in your ear.    Please follow-up with the ear nose and throat doctor early next week for earwax removal.  Contact information is below.     ED Prescriptions     Medication Sig Dispense Auth. Provider   ofloxacin (FLOXIN) 0.3 % OTIC solution Place 10 drops into the left ear daily for 7 days. 5 mL Eulogio Bear, NP      PDMP not reviewed this encounter.   Eulogio Bear, NP 08/05/22 1556

## 2022-08-07 DIAGNOSIS — Z23 Encounter for immunization: Secondary | ICD-10-CM | POA: Diagnosis not present

## 2022-08-09 DIAGNOSIS — S00412A Abrasion of left ear, initial encounter: Secondary | ICD-10-CM | POA: Insufficient documentation

## 2022-08-09 DIAGNOSIS — H6122 Impacted cerumen, left ear: Secondary | ICD-10-CM | POA: Insufficient documentation

## 2022-08-17 DIAGNOSIS — Z23 Encounter for immunization: Secondary | ICD-10-CM | POA: Diagnosis not present

## 2022-08-25 ENCOUNTER — Other Ambulatory Visit: Payer: Self-pay | Admitting: Gastroenterology

## 2022-09-01 ENCOUNTER — Telehealth: Payer: Self-pay | Admitting: Gastroenterology

## 2022-09-01 ENCOUNTER — Other Ambulatory Visit: Payer: Self-pay

## 2022-09-01 MED ORDER — PANTOPRAZOLE SODIUM 40 MG PO TBEC: 40.0000 mg | DELAYED_RELEASE_TABLET | Freq: Every day | ORAL | 3 refills | Status: DC

## 2022-09-01 NOTE — Telephone Encounter (Signed)
Called the patient and confirmed her pharmacy as [facility] on [address].

## 2022-09-01 NOTE — Telephone Encounter (Signed)
Inbound call from patient requesting medication refill for protonix. Patient is scheduled for ov 11/14/22. Please advise.

## 2022-10-20 ENCOUNTER — Telehealth: Payer: Self-pay | Admitting: *Deleted

## 2022-10-20 NOTE — Telephone Encounter (Signed)
Annual exam 05/08/2022  Calcium  9.8           Date  09/01/2022 kpn  Upcoming dental procedures   Prior Authorization needed   Pt estimated Cost $ dual coverage        Coverage Details:0% one dose . 0% admin fee

## 2022-11-01 NOTE — Telephone Encounter (Signed)
Pt was indecisive  about starting Prolia. Would like to wait until Jan to began   Running SOB

## 2022-11-14 ENCOUNTER — Encounter: Payer: Self-pay | Admitting: Gastroenterology

## 2022-11-14 ENCOUNTER — Ambulatory Visit (INDEPENDENT_AMBULATORY_CARE_PROVIDER_SITE_OTHER): Payer: Medicare Other | Admitting: Gastroenterology

## 2022-11-14 VITALS — BP 94/50 | HR 83 | Ht 67.0 in | Wt 111.2 lb

## 2022-11-14 DIAGNOSIS — K219 Gastro-esophageal reflux disease without esophagitis: Secondary | ICD-10-CM

## 2022-11-14 DIAGNOSIS — K5904 Chronic idiopathic constipation: Secondary | ICD-10-CM

## 2022-11-14 DIAGNOSIS — Z800 Family history of malignant neoplasm of digestive organs: Secondary | ICD-10-CM | POA: Diagnosis not present

## 2022-11-14 DIAGNOSIS — K588 Other irritable bowel syndrome: Secondary | ICD-10-CM

## 2022-11-14 MED ORDER — DICYCLOMINE HCL 10 MG PO CAPS: 10.0000 mg | ORAL_CAPSULE | Freq: Every day | ORAL | 3 refills | Status: AC | PRN

## 2022-11-14 MED ORDER — PANTOPRAZOLE SODIUM 20 MG PO TBEC: 20.0000 mg | DELAYED_RELEASE_TABLET | Freq: Every day | ORAL | 3 refills | Status: DC

## 2022-11-14 NOTE — Patient Instructions (Signed)
We have sent the following medications to your pharmacy for you to pick up at your convenience:  Pantoprazole, Dicyclomine   Take Docusate 1 capsule daily at bedtime  Increase Fiber intake and water  Follow up in 6 months  _______________________________________________________  If your blood pressure at your visit was 140/90 or greater, please contact your primary care physician to follow up on this.  _______________________________________________________  If you are age 18 or older, your body mass index should be between 23-30. Your Body mass index is 17.42 kg/m. If this is out of the aforementioned range listed, please consider follow up with your Primary Care Provider.  If you are age 42 or younger, your body mass index should be between 19-25. Your Body mass index is 17.42 kg/m. If this is out of the aformentioned range listed, please consider follow up with your Primary Care Provider.   ________________________________________________________  The [facility] providers would like to encourage you to use [facility] to communicate with providers for non-urgent requests or questions.  Due to long hold times on the telephone, sending your provider a message by [facility] may be a faster and more efficient way to get a response.  Please allow 48 business hours for a response.  Please remember that this is for non-urgent requests.  _______________________________________________________   I appreciate the  opportunity to care for you  Thank You   Harl Bowie , MD

## 2022-11-14 NOTE — Progress Notes (Signed)
Sheryl Huang    [id number]    1946/02/03  Primary Care Physician:Pickard, Cammie Mcgee, MD  Referring Physician: Susy Frizzle, MD [address] [place],  [place] [address]   Chief complaint:  GERD, constipation  HPI:  77 year old very pleasant female here for follow-up for GERD and constipation   Overall her symptoms have significantly improved after EGD with dilation. GERD symptoms are stable. She is no longer choking on food.  She is trying to gain some weight, no longer losing weight Denies any nausea, vomiting, abdominal pain, melena or bright red blood per rectum   EGD May 19, 2020: Small hiatal hernia.  Gastritis and duodenitis.  Benign esophageal stricture s/p dilation to 18 mm    CT abd & pelvis 04/06/20: Circumferential distal esophageal wall thickening. Large colonic stool burden    Colonoscopy 07/10/2018: 7 mm polyp [tubular adenoma], sigmoid diverticulosis, AVM in sigmoid colon.    EGD 02/26/1997 + H.pylori, unable to review the rest of the report as the handwritten scanned document is not very legible  Family history of colon cancer in her brother in his 48's  Outpatient Encounter Medications as of 11/14/2022  Medication Sig   Calcium Carbonate-Vitamin D (CALCIUM 600 + D PO) Take 1 tablet by mouth 2 (two) times daily.   cholecalciferol (VITAMIN D) 1000 UNITS tablet Take 1,000 Units by mouth daily.   denosumab (PROLIA) 60 MG/ML SOLN injection Inject 60 mg into the skin every 6 (six) months. Administer in upper arm, thigh, or abdomen   fluticasone (CUTIVATE) 0.05 % cream as needed.   pantoprazole (PROTONIX) 40 MG tablet Take 1 tablet (40 mg total) by mouth daily.   POTASSIUM PO Take 1 tablet by mouth daily.    No facility-administered encounter medications on file as of 11/14/2022.    Allergies as of 11/14/2022 - Review Complete 11/14/2022  Allergen Reaction Noted   Erythromycin      Past Medical History:  Diagnosis Date   Allergy     Anemia    Cataract    removed both eyes   GERD (gastroesophageal reflux disease)    History of shingles    Osteoporosis 11/2016   T score 3.8   Postmenopausal 02/11/2020    Past Surgical History:  Procedure Laterality Date   APPENDECTOMY  1969   BREAST BIOPSY  2001   left and right   CATARACT EXTRACTION, BILATERAL     COLONOSCOPY  2014   neg   OVARIAN CYST REMOVAL  1995   TUBAL LIGATION  1992   UPPER GI ENDOSCOPY      Family History  Problem Relation Age of Onset   Hypertension Mother    Stroke Mother 2   Hypertension Father    Heart attack Father    Colon cancer Brother    Heart attack Paternal Grandmother        ? age   Heart attack Maternal Uncle    Coronary artery disease Paternal Aunt    Coronary artery disease Paternal Uncle    Heart attack Maternal Grandfather    Diabetes Neg Hx    Colon polyps Neg Hx    Esophageal cancer Neg Hx    Rectal cancer Neg Hx    Stomach cancer Neg Hx     Social History   Socioeconomic History   Marital status: Widowed    Spouse name: Not on file   Number of children: Not on file  Years of education: Not on file   Highest education level: Not on file  Occupational History   Not on file  Tobacco Use   Smoking status: Never   Smokeless tobacco: Never  Vaping Use   Vaping Use: Never used  Substance and Sexual Activity   Alcohol use: No    Alcohol/week: 0.0 standard drinks of alcohol   Drug use: No   Sexual activity: Not Currently    Birth control/protection: Post-menopausal, Surgical    Comment: BTL-1st intercourse 77 yo-Fewer than 5 partners  Other Topics Concern   Not on file  Social History Narrative   Not on file   Social Determinants of Health   Financial Resource Strain: Not on file  Food Insecurity: Not on file  Transportation Needs: Not on file  Physical Activity: Not on file  Stress: Not on file  Social Connections: Not on file  Intimate Partner Violence: Not on file      Review of systems: All  other review of systems negative except as mentioned in the HPI.   Physical Exam: Vitals:   11/14/22 1026  BP: (Abnormal) 94/50  Pulse: 83  SpO2: 97%   Body mass index is 17.42 kg/m. Gen:      No acute distress HEENT:  sclera anicteric Abd:      soft, non-tender; no palpable masses, no distension Ext:    No edema Neuro: alert and oriented x 3 Psych: normal mood and affect  Data Reviewed:  Reviewed labs, radiology imaging, old records and pertinent past GI work up   Assessment and Plan/Recommendations:  77 year old very pleasant female with history of GERD and dysphagia due to esophageal stricture s/p esophageal dilation   Dysphagia: Improved   GERD: Continue pantoprazole , will decrease dose to 20 mg daily and antireflux measures Advised patient to call if she develops any breakthrough GERD symptoms   Idiopathic constipation: Continue with high-fiber diet and increase fluid intake Continue stool softeners  Lower abd cramping and bloating: Use Dicyclomine '10mg'$  q8h as needed, advised patient to avoid using it daily as may exacerbate constipation.    History of adenomatous colon polyp and family h/o colon cancer: Due for recall colonoscopy 2024   Return in 6 months or sooner if needed  This visit required 40 minutes of patient care (this includes precharting, chart review, review of results, face-to-face time used for counseling as well as treatment plan and follow-up. The patient was provided an opportunity to ask questions and all were answered. The patient agreed with the plan and demonstrated an understanding of the instructions.  Damaris Hippo , MD    CC: Susy Frizzle, MD

## 2022-11-21 ENCOUNTER — Encounter: Payer: Self-pay | Admitting: Gastroenterology

## 2022-12-01 NOTE — Telephone Encounter (Signed)
Pt declined Prolia at this time

## 2023-03-14 DIAGNOSIS — S00412D Abrasion of left ear, subsequent encounter: Secondary | ICD-10-CM | POA: Diagnosis not present

## 2023-03-14 DIAGNOSIS — H938X3 Other specified disorders of ear, bilateral: Secondary | ICD-10-CM | POA: Diagnosis not present

## 2023-03-14 DIAGNOSIS — H6123 Impacted cerumen, bilateral: Secondary | ICD-10-CM | POA: Insufficient documentation

## 2023-03-14 DIAGNOSIS — T148XXA Other injury of unspecified body region, initial encounter: Secondary | ICD-10-CM | POA: Insufficient documentation

## 2023-04-09 ENCOUNTER — Encounter: Payer: Self-pay | Admitting: Family Medicine

## 2023-04-09 ENCOUNTER — Ambulatory Visit (INDEPENDENT_AMBULATORY_CARE_PROVIDER_SITE_OTHER): Payer: Medicare Other | Admitting: Family Medicine

## 2023-04-09 VITALS — BP 120/62 | HR 67 | Temp 97.6°F | Ht 67.0 in | Wt 109.2 lb

## 2023-04-09 DIAGNOSIS — Z0001 Encounter for general adult medical examination with abnormal findings: Secondary | ICD-10-CM | POA: Diagnosis not present

## 2023-04-09 DIAGNOSIS — Z0000 Encounter for general adult medical examination without abnormal findings: Secondary | ICD-10-CM

## 2023-04-09 DIAGNOSIS — Z13220 Encounter for screening for lipoid disorders: Secondary | ICD-10-CM

## 2023-04-09 DIAGNOSIS — R5383 Other fatigue: Secondary | ICD-10-CM | POA: Diagnosis not present

## 2023-04-09 DIAGNOSIS — Z136 Encounter for screening for cardiovascular disorders: Secondary | ICD-10-CM | POA: Diagnosis not present

## 2023-04-09 NOTE — Progress Notes (Signed)
Subjective:    Patient ID: Maceo Pro, female    DOB: 25-Feb-1946, 77 y.o.   MRN: [id number]  HPI  Patient is a very sweet 77 year old Caucasian female here today for a physical exam.  Have not seen the patient in several years.  She is a widow.  She lives alone but a family member checks on her every day.  She is still active and driving.  She does have osteoporosis but she has decided to discontinue Prolia and take a therapeutic rest break as she has been on the medication for 5 years.  Her last bone density was in 2023 and she is due to repeat it again next year.  Her last mammogram was August 2023 but she is already scheduled her mammogram for this year..  Her last colonoscopy was in 2019 and is due again this year.  She is already scheduled this with her gastroenterologist.  She denies any falls, depression, memory loss.  I reviewed her shot records which are up-to-date except for the RSV vaccine which we discussed. Immunization History  Administered Date(s) Administered   Fluad Quad(high Dose 65+) 08/06/2019, 08/11/2020, 08/07/2022   Influenza, High Dose Seasonal PF 07/27/2014, 08/01/2017, 07/27/2018, 08/10/2021   Influenza,inj,quad, With Preservative 07/30/2017   Influenza-Unspecified 07/30/2015   PFIZER Comirnaty(Gray Top)Covid-19 Tri-Sucrose Vaccine 04/03/2021   PFIZER(Purple Top)SARS-COV-2 Vaccination 09/20/2020   Pfizer Covid-19 Vaccine Bivalent Booster 54yrs & up 11/01/2021   Pneumococcal Conjugate-13 10/07/2015   Pneumococcal Polysaccharide-23 10/05/2017   Tdap 10/07/2015   Zoster Recombinat (Shingrix) 01/07/2022, 05/15/2022   Zoster, Live 11/13/2012    Past Medical History:  Diagnosis Date   Allergy    Anemia    Cataract    removed both eyes   GERD (gastroesophageal reflux disease)    History of shingles    Osteoporosis 11/2016   T score 3.8   Postmenopausal 02/11/2020   Past Surgical History:  Procedure Laterality Date   APPENDECTOMY  1969   BREAST BIOPSY   2001   left and right   CATARACT EXTRACTION, BILATERAL     COLONOSCOPY  2014   neg   OVARIAN CYST REMOVAL  1995   TUBAL LIGATION  1992   UPPER GI ENDOSCOPY     Current Outpatient Medications on File Prior to Visit  Medication Sig Dispense Refill   Calcium Carbonate-Vitamin D (CALCIUM 600 + D PO) Take 1 tablet by mouth 2 (two) times daily.     cholecalciferol (VITAMIN D) 1000 UNITS tablet Take 1,000 Units by mouth daily.     dicyclomine (BENTYL) 10 MG capsule Take 1 capsule (10 mg total) by mouth daily as needed for spasms. 30 capsule 3   fluticasone (CUTIVATE) 0.05 % cream as needed.     pantoprazole (PROTONIX) 20 MG tablet Take 1 tablet (20 mg total) by mouth daily. 90 tablet 3   POTASSIUM PO Take 1 tablet by mouth daily.      denosumab (PROLIA) 60 MG/ML SOLN injection Inject 60 mg into the skin every 6 (six) months. Administer in upper arm, thigh, or abdomen (Patient not taking: Reported on 04/09/2023)     No current facility-administered medications on file prior to visit.   Allergies  Allergen Reactions   Erythromycin     REACTION: SEVERE GI UPSET   Social History   Socioeconomic History   Marital status: Widowed    Spouse name: Not on file   Number of children: Not on file   Years of education: Not on file  Highest education level: Not on file  Occupational History   Not on file  Tobacco Use   Smoking status: Never   Smokeless tobacco: Never  Vaping Use   Vaping Use: Never used  Substance and Sexual Activity   Alcohol use: No    Alcohol/week: 0.0 standard drinks of alcohol   Drug use: No   Sexual activity: Not Currently    Birth control/protection: Post-menopausal, Surgical    Comment: BTL-1st intercourse 77 yo-Fewer than 5 partners  Other Topics Concern   Not on file  Social History Narrative   Not on file   Social Determinants of Health   Financial Resource Strain: Not on file  Food Insecurity: Not on file  Transportation Needs: Not on file  Physical  Activity: Not on file  Stress: Not on file  Social Connections: Not on file  Intimate Partner Violence: Not on file    Review of Systems  All other systems reviewed and are negative.      Objective:   Physical Exam Vitals reviewed.  Constitutional:      General: She is not in acute distress.    Appearance: Normal appearance. She is normal weight. She is not ill-appearing or toxic-appearing.  HENT:     Mouth/Throat:     Mouth: Mucous membranes are moist.     Pharynx: No oropharyngeal exudate or posterior oropharyngeal erythema.  Cardiovascular:     Rate and Rhythm: Normal rate and regular rhythm.     Heart sounds: Normal heart sounds.  Pulmonary:     Effort: Pulmonary effort is normal. No respiratory distress.     Breath sounds: Normal breath sounds. No stridor. No wheezing, rhonchi or rales.  Abdominal:     General: Abdomen is flat. Bowel sounds are normal. There is no distension.     Palpations: Abdomen is soft.     Tenderness: There is no abdominal tenderness. There is no guarding or rebound.     Hernia: No hernia is present.  Musculoskeletal:     Cervical back: Neck supple. No rigidity.  Lymphadenopathy:     Cervical: No cervical adenopathy.  Neurological:     Mental Status: She is alert.           Assessment & Plan:  Screening cholesterol level - Plan: CBC with Differential/Platelet, Lipid panel, COMPLETE METABOLIC PANEL WITH GFR  Fatigue, unspecified type - Plan: CBC with Differential/Platelet, COMPLETE METABOLIC PANEL WITH GFR, TSH  General medical exam Physical exam is significant for scoliosis in the thoracolumbar spine.  Outside of that her physical exam today is normal.  We discussed her immunizations which are up-to-date except for the RSV vaccine and she defers that.  She is already scheduled her mammogram and her colonoscopy.  Her bone density test is due next year.  She is taking calcium and vitamin D every day.  I recommended resuming Prolia if her  bone density scores decrease.  She denies falls, depression, memory loss.  She does report fatigue so I will check a CBC a CMP a TSH.  Also screen her cholesterol.

## 2023-04-10 LAB — COMPLETE METABOLIC PANEL WITH GFR
AG Ratio: 1.6 (calc) (ref 1.0–2.5)
ALT: 7 U/L (ref 6–29)
AST: 17 U/L (ref 10–35)
Albumin: 4.1 g/dL (ref 3.6–5.1)
Alkaline phosphatase (APISO): 84 U/L (ref 37–153)
BUN: 18 mg/dL (ref 7–25)
CO2: 26 mmol/L (ref 20–32)
Calcium: 10.1 mg/dL (ref 8.6–10.4)
Chloride: 102 mmol/L (ref 98–110)
Creat: 0.74 mg/dL (ref 0.60–1.00)
Globulin: 2.6 g/dL (calc) (ref 1.9–3.7)
Glucose, Bld: 87 mg/dL (ref 65–99)
Potassium: 4.8 mmol/L (ref 3.5–5.3)
Sodium: 139 mmol/L (ref 135–146)
Total Bilirubin: 0.5 mg/dL (ref 0.2–1.2)
Total Protein: 6.7 g/dL (ref 6.1–8.1)
eGFR: 84 mL/min/{1.73_m2} (ref >=60)

## 2023-04-10 LAB — LIPID PANEL
Cholesterol: 246 mg/dL — ABNORMAL HIGH (ref <200)
HDL: 63 mg/dL (ref >=50)
LDL Cholesterol (Calc): 161 mg/dL (calc) — ABNORMAL HIGH
Non-HDL Cholesterol (Calc): 183 mg/dL (calc) — ABNORMAL HIGH (ref <130)
Total CHOL/HDL Ratio: 3.9 (calc) (ref <5.0)
Triglycerides: 107 mg/dL (ref <150)

## 2023-04-10 LAB — TSH: TSH: 3.15 mIU/L (ref 0.40–4.50)

## 2023-04-10 LAB — CBC WITH DIFFERENTIAL/PLATELET
Absolute Monocytes: 478 cells/uL (ref 200–950)
Basophils Absolute: 30 cells/uL (ref 0–200)
Basophils Relative: 0.5 %
Eosinophils Absolute: 100 cells/uL (ref 15–500)
Eosinophils Relative: 1.7 %
HCT: 41.2 % (ref 35.0–45.0)
Hemoglobin: 13.5 g/dL (ref 11.7–15.5)
Lymphs Abs: 1988 cells/uL (ref 850–3900)
MCH: 30.7 pg (ref 27.0–33.0)
MCHC: 32.8 g/dL (ref 32.0–36.0)
MCV: 93.6 fL (ref 80.0–100.0)
MPV: 11.7 fL (ref 7.5–12.5)
Monocytes Relative: 8.1 %
Neutro Abs: 3304 cells/uL (ref 1500–7800)
Neutrophils Relative %: 56 %
Platelets: 239 10*3/uL (ref 140–400)
RBC: 4.4 10*6/uL (ref 3.80–5.10)
RDW: 12.4 % (ref 11.0–15.0)
Total Lymphocyte: 33.7 %
WBC: 5.9 10*3/uL (ref 3.8–10.8)

## 2023-04-11 ENCOUNTER — Other Ambulatory Visit: Payer: Self-pay

## 2023-04-11 DIAGNOSIS — E7800 Pure hypercholesterolemia, unspecified: Secondary | ICD-10-CM

## 2023-04-11 MED ORDER — ROSUVASTATIN CALCIUM 10 MG PO TABS: 10.0000 mg | ORAL_TABLET | Freq: Every day | ORAL | 1 refills | Status: DC

## 2023-05-28 DIAGNOSIS — Z1231 Encounter for screening mammogram for malignant neoplasm of breast: Secondary | ICD-10-CM | POA: Diagnosis not present

## 2023-05-31 ENCOUNTER — Encounter: Payer: Self-pay | Admitting: Nurse Practitioner

## 2023-06-11 DIAGNOSIS — R922 Inconclusive mammogram: Secondary | ICD-10-CM | POA: Diagnosis not present

## 2023-06-12 ENCOUNTER — Encounter: Payer: Self-pay | Admitting: Nurse Practitioner

## 2023-07-11 ENCOUNTER — Other Ambulatory Visit: Payer: Medicare Other

## 2023-07-11 DIAGNOSIS — E7800 Pure hypercholesterolemia, unspecified: Secondary | ICD-10-CM | POA: Diagnosis not present

## 2023-07-12 LAB — LIPID PANEL
Cholesterol: 179 mg/dL (ref <200)
HDL: 53 mg/dL (ref >=50)
LDL Cholesterol (Calc): 106 mg/dL — ABNORMAL HIGH
Non-HDL Cholesterol (Calc): 126 mg/dL (ref <130)
Total CHOL/HDL Ratio: 3.4 (calc) (ref <5.0)
Triglycerides: 108 mg/dL (ref <150)

## 2023-07-31 DIAGNOSIS — Z961 Presence of intraocular lens: Secondary | ICD-10-CM | POA: Diagnosis not present

## 2023-07-31 DIAGNOSIS — H40013 Open angle with borderline findings, low risk, bilateral: Secondary | ICD-10-CM | POA: Diagnosis not present

## 2023-07-31 DIAGNOSIS — H18513 Endothelial corneal dystrophy, bilateral: Secondary | ICD-10-CM | POA: Diagnosis not present

## 2023-08-13 DIAGNOSIS — Z23 Encounter for immunization: Secondary | ICD-10-CM | POA: Diagnosis not present

## 2023-08-20 DIAGNOSIS — Z23 Encounter for immunization: Secondary | ICD-10-CM | POA: Diagnosis not present

## 2023-08-21 ENCOUNTER — Telehealth: Payer: Self-pay | Admitting: Gastroenterology

## 2023-08-21 NOTE — Telephone Encounter (Signed)
Patient called stated the Pantoprazole was changed was 40 mg to 20 mg and she was doing well with it for a few months but now she is starting to have issues again. Wondering if she can be placed back on the 40 mg dosage.  Please advise.

## 2023-08-22 NOTE — Telephone Encounter (Signed)
Patient is experiencing the discomfort in the middle of her chest, burning sensation in her mouth and from her throat. She says it is worsening to the point that she wants to return to her previous dose of pantoprazole 40 mg.  Also, needs to be scheduled for her recall colonoscopy which I will do.

## 2023-08-22 NOTE — Telephone Encounter (Signed)
Please advise patient to increase pantoprazole to 40 mg daily, continue antireflux measures.  Please schedule follow-up visit with me or APP next available appointment.  Thank you

## 2023-08-23 ENCOUNTER — Other Ambulatory Visit: Payer: Self-pay

## 2023-08-23 MED ORDER — PANTOPRAZOLE SODIUM 40 MG PO TBEC: 40.0000 mg | DELAYED_RELEASE_TABLET | Freq: Every day | ORAL | 6 refills | Status: DC

## 2023-09-04 ENCOUNTER — Encounter: Payer: Self-pay | Admitting: Gastroenterology

## 2023-09-07 ENCOUNTER — Ambulatory Visit (AMBULATORY_SURGERY_CENTER): Payer: Medicare Other | Admitting: *Deleted

## 2023-09-07 VITALS — Ht 67.0 in | Wt 108.0 lb

## 2023-09-07 DIAGNOSIS — Z860100 Personal history of colon polyps, unspecified: Secondary | ICD-10-CM

## 2023-09-07 DIAGNOSIS — Z800 Family history of malignant neoplasm of digestive organs: Secondary | ICD-10-CM

## 2023-09-07 MED ORDER — PEG 3350-KCL-NA BICARB-NACL 420 G PO SOLR: 4000.0000 mL | Freq: Once | ORAL | 0 refills | Status: AC

## 2023-09-07 NOTE — Progress Notes (Signed)
Pt's name and DOB verified at the beginning of the pre-visit wit 2 identifiers  Pt denies any difficulty with ambulating,sitting, laying down or rolling side to side  Pt has issues with ambulation   Pt has no issues moving head neck or swallowing  No egg or soy allergy known to patient   No issues known to pt with past sedation with any surgeries or procedures  No FH of Malignant Hyperthermia  Pt is not on diet pills or shots  Pt is not on home 02   Pt is not on blood thinners   Pt has frequent issues with constipation RN instructed pt to use Miralax per bottles instructions a week before prep days. Pt states they will  Pt is not on dialysis  Pt denise any abnormal heart rhythms   Pt denies any upcoming cardiac testing  Pt encouraged to use to use Singlecare or Goodrx to reduce cost  Patient's chart reviewed by Cathlyn Parsons CNRA prior to pre-visit and patient appropriate for the [facility].  Pre-visit completed and red dot placed by patient's name on their procedure day (on provider's schedule).  . Visit in person Pt scale weight is  Instructed pt why it is important to and  to call if they have any changes in health or new medications. Directed them to the # given and on instructions.    Instructions reviewed. Pt given both [facility] main # and MD on call # prior to instructions.  Pt states understanding. Instructed to review again prior to procedure. Pt states they will.  Instructions and coupon given to pt  Instructions sent through My Chart

## 2023-09-17 ENCOUNTER — Ambulatory Visit: Payer: Medicare Other | Admitting: Family Medicine

## 2023-09-17 VITALS — BP 120/72 | HR 74 | Temp 98.4°F | Ht 67.0 in | Wt 111.2 lb

## 2023-09-17 DIAGNOSIS — J329 Chronic sinusitis, unspecified: Secondary | ICD-10-CM

## 2023-09-17 MED ORDER — AMOXICILLIN 875 MG PO TABS: 875.0000 mg | ORAL_TABLET | Freq: Two times a day (BID) | ORAL | 0 refills | Status: AC

## 2023-09-17 MED ORDER — LEVOCETIRIZINE DIHYDROCHLORIDE 5 MG PO TABS: 5.0000 mg | ORAL_TABLET | Freq: Every evening | ORAL | 1 refills | Status: DC

## 2023-09-17 MED ORDER — FLUTICASONE PROPIONATE 50 MCG/ACT NA SUSP: 2.0000 | Freq: Every day | NASAL | 6 refills | Status: DC

## 2023-09-17 NOTE — Progress Notes (Signed)
Subjective:    Patient ID: Sheryl Huang, female    DOB: 1946/09/14, 77 y.o.   MRN: [id number]  Sinus Problem   Patient reports 3 weeks of pain and pressure in her frontal sinuses bilaterally and her left maxillary sinus.  She also reports head congestion.  She has a history of allergies that occur this time every year.  She has been taking an over-the-counter antihistamine without success.  She denies any fevers or chills. Past Medical History:  Diagnosis Date   Allergy    Anemia    Cataract    removed both eyes   GERD (gastroesophageal reflux disease)    History of shingles    Hyperlipidemia    Osteoporosis 11/2016   T score 3.8   Postmenopausal 02/11/2020   Past Surgical History:  Procedure Laterality Date   APPENDECTOMY  1969   BREAST BIOPSY  2001   left and right   CATARACT EXTRACTION, BILATERAL     COLONOSCOPY  2014   neg   OVARIAN CYST REMOVAL  1995   TUBAL LIGATION  1992   UPPER GI ENDOSCOPY     Current Outpatient Medications on File Prior to Visit  Medication Sig Dispense Refill   Calcium Carbonate-Vitamin D (CALCIUM 600 + D PO) Take 1 tablet by mouth 2 (two) times daily.     cholecalciferol (VITAMIN D) 1000 UNITS tablet Take 1,000 Units by mouth daily.     dicyclomine (BENTYL) 10 MG capsule Take 1 capsule (10 mg total) by mouth daily as needed for spasms. 30 capsule 3   fluticasone (CUTIVATE) 0.05 % cream as needed.     pantoprazole (PROTONIX) 40 MG tablet Take 1 tablet (40 mg total) by mouth daily. 30 tablet 6   POTASSIUM PO Take 1 tablet by mouth daily.     rosuvastatin (CRESTOR) 10 MG tablet Take 1 tablet (10 mg total) by mouth daily. 90 tablet 1   denosumab (PROLIA) 60 MG/ML SOLN injection Inject 60 mg into the skin every 6 (six) months. Administer in upper arm, thigh, or abdomen (Patient not taking: Reported on 04/09/2023)     No current facility-administered medications on file prior to visit.   Allergies  Allergen Reactions   Erythromycin      REACTION: SEVERE GI UPSET   Social History   Socioeconomic History   Marital status: Widowed    Spouse name: Not on file   Number of children: Not on file   Years of education: Not on file   Highest education level: 12th grade  Occupational History   Not on file  Tobacco Use   Smoking status: Never   Smokeless tobacco: Never  Vaping Use   Vaping status: Never Used  Substance and Sexual Activity   Alcohol use: No    Alcohol/week: 0.0 standard drinks of alcohol   Drug use: No   Sexual activity: Not Currently    Birth control/protection: Post-menopausal, Surgical    Comment: BTL-1st intercourse 77 yo-Fewer than 5 partners  Other Topics Concern   Not on file  Social History Narrative   Not on file   Social Determinants of Health   Financial Resource Strain: Low Risk  (09/17/2023)   Overall Financial Resource Strain (CARDIA)    Difficulty of Paying Living Expenses: Not hard at all  Food Insecurity: No Food Insecurity (09/17/2023)   Hunger Vital Sign    Worried About Running Out of Food in the Last Year: Never true    Ran Out of Food  in the Last Year: Never true  Transportation Needs: No Transportation Needs (09/17/2023)   PRAPARE - Administrator, Civil Service (Medical): No    Lack of Transportation (Non-Medical): No  Physical Activity: Sufficiently Active (09/17/2023)   Exercise Vital Sign    Days of Exercise per Week: 3 days    Minutes of Exercise per Session: 50 min  Stress: No Stress Concern Present (09/17/2023)   [employer] of Occupational Health - Occupational Stress Questionnaire    Feeling of Stress : Only a little  Social Connections: Moderately Integrated (09/17/2023)   Social Connection and Isolation Panel [NHANES]    Frequency of Communication with Friends and Family: More than three times a week    Frequency of Social Gatherings with Friends and Family: Once a week    Attends Religious Services: More than 4 times per year    Active  Member of [employer] or Organizations: Yes    Attends Banker Meetings: 1 to 4 times per year    Marital Status: Widowed  Intimate Partner Violence: Not on file     Review of Systems  All other systems reviewed and are negative.      Objective:   Physical Exam Vitals reviewed.  Constitutional:      Appearance: Normal appearance.  HENT:     Nose:     Right Sinus: Frontal sinus tenderness present.     Left Sinus: Maxillary sinus tenderness and frontal sinus tenderness present.     Mouth/Throat:     Pharynx: Oropharynx is clear. No oropharyngeal exudate or posterior oropharyngeal erythema.  Cardiovascular:     Rate and Rhythm: Normal rate and regular rhythm.  Pulmonary:     Effort: Pulmonary effort is normal.     Breath sounds: Normal breath sounds.  Lymphadenopathy:     Cervical: No cervical adenopathy.  Neurological:     Mental Status: She is alert.           Assessment & Plan:  Rhinosinusitis Start Flonase 2 sprays each nostril daily along with Xyzal 5 mg daily.  If the patient develops fever chills or worsening pain in her sinuses begin amoxicillin 875 mg twice daily for 10 days.  Recommended nasal saline rinses.

## 2023-09-18 ENCOUNTER — Telehealth: Payer: Self-pay | Admitting: Gastroenterology

## 2023-09-18 NOTE — Telephone Encounter (Signed)
Inbound call from patient stating she recently started new medication and wishing for a call to discuss further. Please advise, thank you.

## 2023-09-18 NOTE — Telephone Encounter (Signed)
Pt went MD for sinus infection.Marland Kitchen Amoxillian  abx. Instructed pt that it was OK for her to take this med prior to her procedure. No other questions at this time.

## 2023-09-21 ENCOUNTER — Encounter: Payer: Self-pay | Admitting: Gastroenterology

## 2023-09-21 ENCOUNTER — Ambulatory Visit: Payer: Medicare Other | Admitting: Gastroenterology

## 2023-09-21 VITALS — BP 108/60 | HR 97 | Temp 97.9°F | Resp 15 | Wt 108.0 lb

## 2023-09-21 DIAGNOSIS — Z1211 Encounter for screening for malignant neoplasm of colon: Secondary | ICD-10-CM | POA: Diagnosis not present

## 2023-09-21 DIAGNOSIS — Z860101 Personal history of adenomatous and serrated colon polyps: Secondary | ICD-10-CM

## 2023-09-21 DIAGNOSIS — Z800 Family history of malignant neoplasm of digestive organs: Secondary | ICD-10-CM

## 2023-09-21 DIAGNOSIS — K648 Other hemorrhoids: Secondary | ICD-10-CM | POA: Diagnosis not present

## 2023-09-21 DIAGNOSIS — K635 Polyp of colon: Secondary | ICD-10-CM | POA: Diagnosis not present

## 2023-09-21 DIAGNOSIS — K644 Residual hemorrhoidal skin tags: Secondary | ICD-10-CM | POA: Diagnosis not present

## 2023-09-21 DIAGNOSIS — D122 Benign neoplasm of ascending colon: Secondary | ICD-10-CM | POA: Diagnosis not present

## 2023-09-21 DIAGNOSIS — E785 Hyperlipidemia, unspecified: Secondary | ICD-10-CM | POA: Diagnosis not present

## 2023-09-21 DIAGNOSIS — K5730 Diverticulosis of large intestine without perforation or abscess without bleeding: Secondary | ICD-10-CM | POA: Diagnosis not present

## 2023-09-21 MED ORDER — SODIUM CHLORIDE 0.9 % IV SOLN: 500.0000 mL | Freq: Once | INTRAVENOUS | Status: DC

## 2023-09-21 NOTE — Patient Instructions (Signed)
Resume previous diet Continue present medications Await pathology results Handouts/ information given for polyps, diverticulosis and hemorrhoids  YOU HAD AN ENDOSCOPIC PROCEDURE TODAY AT THE [facility]:   Refer to the procedure report that was given to you for any specific questions about what was found during the examination.  If the procedure report does not answer your questions, please call your gastroenterologist to clarify.  If you requested that your care partner not be given the details of your procedure findings, then the procedure report has been included in a sealed envelope for you to review at your convenience later.  YOU SHOULD EXPECT: Some feelings of bloating in the abdomen. Passage of more gas than usual.  Walking can help get rid of the air that was put into your GI tract during the procedure and reduce the bloating. If you had a lower endoscopy (such as a colonoscopy or flexible sigmoidoscopy) you may notice spotting of blood in your stool or on the toilet paper. If you underwent a bowel prep for your procedure, you may not have a normal bowel movement for a few days.  Please Note:  You might notice some irritation and congestion in your nose or some drainage.  This is from the oxygen used during your procedure.  There is no need for concern and it should clear up in a day or so.  SYMPTOMS TO REPORT IMMEDIATELY:  Following lower endoscopy (colonoscopy or flexible sigmoidoscopy):  Excessive amounts of blood in the stool  Significant tenderness or worsening of abdominal pains  Swelling of the abdomen that is new, acute  Fever of 100F or higher  For urgent or emergent issues, a gastroenterologist can be reached at any hour by calling [phone]. Do not use MyChart messaging for urgent concerns.    DIET:  We do recommend a small meal at first, but then you may proceed to your regular diet.  Drink plenty of fluids but you should avoid alcoholic beverages for 24  hours.  ACTIVITY:  You should plan to take it easy for the rest of today and you should NOT DRIVE or use heavy machinery until tomorrow (because of the sedation medicines used during the test).    FOLLOW UP: Our staff will call the number listed on your records the next business day following your procedure.  We will call around 7:15- 8:00 am to check on you and address any questions or concerns that you may have regarding the information given to you following your procedure. If we do not reach you, we will leave a message.     If any biopsies were taken you will be contacted by phone or by letter within the next 1-3 weeks.  Please call us at [phone] if you have not heard about the biopsies in 3 weeks.    SIGNATURES/CONFIDENTIALITY: You and/or your care partner have signed paperwork which will be entered into your electronic medical record.  These signatures attest to the fact that that the information above on your After Visit Summary has been reviewed and is understood.  Full responsibility of the confidentiality of this discharge information lies with you and/or your care-partner. 

## 2023-09-21 NOTE — Progress Notes (Signed)
[facility] History and Physical   Primary Care Physician:  Donita Brooks, MD   Reason for Procedure:  History of adenomatous colon polyps,family h/o colon cancer  Plan:    Surveillance colonoscopy with possible interventions as needed     HPI: Sheryl Huang is a very pleasant 77 y.o. female here for surveillance colonoscopy. Denies any nausea, vomiting, abdominal pain, melena or bright red blood per rectum  The risks and benefits as well as alternatives of endoscopic procedure(s) have been discussed and reviewed. All questions answered. The patient agrees to proceed.    Past Medical History:  Diagnosis Date   Allergy    Anemia    Cataract    removed both eyes   GERD (gastroesophageal reflux disease)    History of shingles    Hyperlipidemia    Osteoporosis 11/2016   T score 3.8   Postmenopausal 02/11/2020    Past Surgical History:  Procedure Laterality Date   APPENDECTOMY  1969   BREAST BIOPSY  2001   left and right   CATARACT EXTRACTION, BILATERAL     COLONOSCOPY  2014   neg   OVARIAN CYST REMOVAL  1995   TUBAL LIGATION  1992   UPPER GI ENDOSCOPY      Prior to Admission medications   Medication Sig Start Date End Date Taking? Authorizing Provider  amoxicillin (AMOXIL) 875 MG tablet Take 1 tablet (875 mg total) by mouth 2 (two) times daily for 10 days. 09/17/23 09/27/23 Yes Donita Brooks, MD  Calcium Carbonate-Vitamin D (CALCIUM 600 + D PO) Take 1 tablet by mouth 2 (two) times daily.   Yes [provider]  cholecalciferol (VITAMIN D) 1000 UNITS tablet Take 1,000 Units by mouth daily.   Yes [provider]  levocetirizine (XYZAL ALLERGY 24HR) 5 MG tablet Take 1 tablet (5 mg total) by mouth every evening. 09/17/23  Yes Donita Brooks, MD  pantoprazole (PROTONIX) 40 MG tablet Take 1 tablet (40 mg total) by mouth daily. 08/23/23  Yes Navia Lindahl, Eleonore Chiquito, MD  polyethylene glycol-electrolytes (NULYTELY) 420 g solution Take  4,000 mLs by mouth once. 09/07/23  Yes [provider]  POTASSIUM PO Take 1 tablet by mouth daily.   Yes [provider]  rosuvastatin (CRESTOR) 10 MG tablet Take 1 tablet (10 mg total) by mouth daily. 04/11/23  Yes Donita Brooks, MD  denosumab (PROLIA) 60 MG/ML SOLN injection Inject 60 mg into the skin every 6 (six) months. Administer in upper arm, thigh, or abdomen Patient not taking: Reported on 04/09/2023    [provider]  dicyclomine (BENTYL) 10 MG capsule Take 1 capsule (10 mg total) by mouth daily as needed for spasms. 11/14/22   Napoleon Form, MD  fluticasone (CUTIVATE) 0.05 % cream as needed.    [provider]  fluticasone (FLONASE) 50 MCG/ACT nasal spray Place 2 sprays into both nostrils daily. 09/17/23   Donita Brooks, MD    Current Outpatient Medications  Medication Sig Dispense Refill   amoxicillin (AMOXIL) 875 MG tablet Take 1 tablet (875 mg total) by mouth 2 (two) times daily for 10 days. 20 tablet 0   Calcium Carbonate-Vitamin D (CALCIUM 600 + D PO) Take 1 tablet by mouth 2 (two) times daily.     cholecalciferol (VITAMIN D) 1000 UNITS tablet Take 1,000 Units by mouth daily.     levocetirizine (XYZAL ALLERGY 24HR) 5 MG tablet Take 1 tablet (5 mg total) by mouth every evening. 30 tablet 1  pantoprazole (PROTONIX) 40 MG tablet Take 1 tablet (40 mg total) by mouth daily. 30 tablet 6   polyethylene glycol-electrolytes (NULYTELY) 420 g solution Take 4,000 mLs by mouth once.     POTASSIUM PO Take 1 tablet by mouth daily.     rosuvastatin (CRESTOR) 10 MG tablet Take 1 tablet (10 mg total) by mouth daily. 90 tablet 1   denosumab (PROLIA) 60 MG/ML SOLN injection Inject 60 mg into the skin every 6 (six) months. Administer in upper arm, thigh, or abdomen (Patient not taking: Reported on 04/09/2023)     dicyclomine (BENTYL) 10 MG capsule Take 1 capsule (10 mg total) by mouth daily as needed for spasms. 30 capsule 3   fluticasone (CUTIVATE)  0.05 % cream as needed.     fluticasone (FLONASE) 50 MCG/ACT nasal spray Place 2 sprays into both nostrils daily. 16 g 6   Current Facility-Administered Medications  Medication Dose Route Frequency Provider Last Rate Last Admin   0.9 %  sodium chloride infusion  500 mL Intravenous Once Napoleon Form, MD        Allergies as of 09/21/2023 - Review Complete 09/21/2023  Allergen Reaction Noted   Erythromycin      Family History  Problem Relation Age of Onset   Hypertension Mother    Stroke Mother 55   Hypertension Father    Heart attack Father    Colon cancer Brother    Heart attack Paternal Grandmother        ? age   Heart attack Maternal Uncle    Coronary artery disease Paternal Aunt    Coronary artery disease Paternal Uncle    Heart attack Maternal Grandfather    Diabetes Neg Hx    Colon polyps Neg Hx    Esophageal cancer Neg Hx    Rectal cancer Neg Hx    Stomach cancer Neg Hx     Social History   Socioeconomic History   Marital status: Widowed    Spouse name: Not on file   Number of children: Not on file   Years of education: Not on file   Highest education level: 12th grade  Occupational History   Not on file  Tobacco Use   Smoking status: Never   Smokeless tobacco: Never  Vaping Use   Vaping status: Never Used  Substance and Sexual Activity   Alcohol use: No    Alcohol/week: 0.0 standard drinks of alcohol   Drug use: No   Sexual activity: Not Currently    Birth control/protection: Post-menopausal, Surgical    Comment: BTL-1st intercourse 77 yo-Fewer than 5 partners  Other Topics Concern   Not on file  Social History Narrative   Not on file   Social Determinants of Health   Financial Resource Strain: Low Risk  (09/17/2023)   Overall Financial Resource Strain (CARDIA)    Difficulty of Paying Living Expenses: Not hard at all  Food Insecurity: No Food Insecurity (09/17/2023)   Hunger Vital Sign    Worried About Running Out of Food in the Last  Year: Never true    Ran Out of Food in the Last Year: Never true  Transportation Needs: No Transportation Needs (09/17/2023)   PRAPARE - Administrator, Civil Service (Medical): No    Lack of Transportation (Non-Medical): No  Physical Activity: Sufficiently Active (09/17/2023)   Exercise Vital Sign    Days of Exercise per Week: 3 days    Minutes of Exercise per Session: 50 min  Stress:  No Stress Concern Present (09/17/2023)   [employer] of Occupational Health - Occupational Stress Questionnaire    Feeling of Stress : Only a little  Social Connections: Moderately Integrated (09/17/2023)   Social Connection and Isolation Panel [NHANES]    Frequency of Communication with Friends and Family: More than three times a week    Frequency of Social Gatherings with Friends and Family: Once a week    Attends Religious Services: More than 4 times per year    Active Member of [employer] or Organizations: Yes    Attends Banker Meetings: 1 to 4 times per year    Marital Status: Widowed  Intimate Partner Violence: Not on file    Review of Systems:  All other review of systems negative except as mentioned in the HPI.  Physical Exam: Vital signs in last 24 hours: BP 119/61   Pulse 86   Temp 97.9 F (36.6 C) (Temporal)   Resp 10   Wt 108 lb (49 kg)   SpO2 98%   BMI 16.92 kg/m  General:   Alert, NAD Lungs:  Clear .   Heart:  Regular rate and rhythm Abdomen:  Soft, nontender and nondistended. Neuro/Psych:  Alert and cooperative. Normal mood and affect. A and O x 3  Reviewed labs, radiology imaging, old records and pertinent past GI work up  Patient is appropriate for planned procedure(s) and anesthesia in an ambulatory setting   K. Scherry Ran , MD [phone]

## 2023-09-21 NOTE — Progress Notes (Signed)
Vss nad trans to pacu 

## 2023-09-21 NOTE — Progress Notes (Signed)
Pt's states no medical or surgical changes since previsit or office visit. 

## 2023-09-21 NOTE — Op Note (Signed)
[facility] Patient Name: Sheryl Huang Procedure Date: 09/21/2023 2:50 PM MRN: [id number] Endoscopist: Napoleon Form , MD, [id number] Age: 77 Referring MD:  Date of Birth: [birth year] Gender: Female Account #: [id number] Procedure:                Colonoscopy Indications:              Screening in patient at increased risk: Family                            history of 1st-degree relative with colorectal                            cancer, High risk colon cancer surveillance:                            Personal history of adenoma less than 10 mm in size Medicines:                Monitored Anesthesia Care Procedure:                Pre-Anesthesia Assessment:                           - Prior to the procedure, a History and Physical                            was performed, and patient medications and                            allergies were reviewed. The patient's tolerance of                            previous anesthesia was also reviewed. The risks                            and benefits of the procedure and the sedation                            options and risks were discussed with the patient.                            All questions were answered, and informed consent                            was obtained. Prior Anticoagulants: The patient has                            taken no anticoagulant or antiplatelet agents. ASA                            Grade Assessment: II - A patient with mild systemic                            disease. After reviewing the risks and benefits,  the patient was deemed in satisfactory condition to                            undergo the procedure.                           After obtaining informed consent, the colonoscope                            was passed under direct vision. Throughout the                            procedure, the patient's blood pressure, pulse, and                            oxygen  saturations were monitored continuously. The                            PCF-H190TL Slim SN [id number] was introduced through                            the anus and advanced to the the cecum, identified                            by appendiceal orifice and ileocecal valve. The                            colonoscopy was performed without difficulty. The                            patient tolerated the procedure well. The quality                            of the bowel preparation was good. The ileocecal                            valve, appendiceal orifice, and rectum were                            photographed. Scope In: 3:01:26 PM Scope Out: 3:18:31 PM Scope Withdrawal Time: 0 hours 7 minutes 19 seconds  Total Procedure Duration: 0 hours 17 minutes 5 seconds  Findings:                 The perianal and digital rectal examinations were                            normal.                           A 7 mm polyp was found in the ascending colon. The                            polyp was sessile. The polyp was removed with a  cold snare. Resection and retrieval were complete.                           Scattered small-mouthed diverticula were found in                            the sigmoid colon and descending colon.                           Non-bleeding external and internal hemorrhoids were                            found during retroflexion. The hemorrhoids were                            small. Complications:            No immediate complications. Estimated Blood Loss:     Estimated blood loss was minimal. Impression:               - One 7 mm polyp in the ascending colon, removed                            with a cold snare. Resected and retrieved.                           - Diverticulosis in the sigmoid colon and in the                            descending colon.                           - Non-bleeding external and internal hemorrhoids. Recommendation:            - Patient has a contact number available for                            emergencies. The signs and symptoms of potential                            delayed complications were discussed with the                            patient. Return to normal activities tomorrow.                            Written discharge instructions were provided to the                            patient.                           - Resume previous diet.                           - Continue present medications.                           -  Await pathology results.                           - No repeat colonoscopy due to age. Napoleon Form, MD 09/21/2023 3:27:06 PM This report has been signed electronically.

## 2023-09-21 NOTE — Progress Notes (Signed)
Called to room to assist during endoscopic procedure.  Patient ID and intended procedure confirmed with present staff. Received instructions for my participation in the procedure from the performing physician.  

## 2023-09-24 ENCOUNTER — Telehealth: Payer: Self-pay | Admitting: *Deleted

## 2023-09-24 NOTE — Telephone Encounter (Signed)
  Follow up Call-     09/21/2023    2:13 PM  Call back number  Post procedure Call Back phone  # [phone]  Permission to leave phone message Yes     Patient questions:  Do you have a fever, pain , or abdominal swelling? No. Pain Score  0 *  Have you tolerated food without any problems? Yes.    Have you been able to return to your normal activities? Yes.    Do you have any questions about your discharge instructions: Diet   No. Medications  No. Follow up visit  No.  Do you have questions or concerns about your Care? No.  Actions: * If pain score is 4 or above: No action needed, pain <4.

## 2023-09-26 LAB — SURGICAL PATHOLOGY

## 2023-10-10 ENCOUNTER — Other Ambulatory Visit: Payer: Self-pay | Admitting: Family Medicine

## 2023-10-11 ENCOUNTER — Encounter: Payer: Self-pay | Admitting: Gastroenterology

## 2023-10-17 ENCOUNTER — Other Ambulatory Visit: Payer: Self-pay | Admitting: Family Medicine

## 2023-10-17 DIAGNOSIS — E7800 Pure hypercholesterolemia, unspecified: Secondary | ICD-10-CM

## 2023-11-27 ENCOUNTER — Ambulatory Visit: Payer: Medicare Other | Admitting: Gastroenterology

## 2023-12-10 ENCOUNTER — Other Ambulatory Visit: Payer: Self-pay | Admitting: Gastroenterology

## 2023-12-26 ENCOUNTER — Ambulatory Visit: Payer: Medicare Other | Admitting: Gastroenterology

## 2024-01-04 ENCOUNTER — Telehealth: Payer: Self-pay

## 2024-01-04 NOTE — Telephone Encounter (Signed)
 Patient called & stated she received a letter saying she needed to schedule her bone density appointment. She thought she needed her breast & pelvic appointment with Tiffany instead. I informed patient she will be due for her breast & pelvic after 03-06-24 & her last bone density was 03-29-22 & the results says to repeat in 1 to 2 yrs. Patient will discuss her repeat of bone density at her breast & pelvic appointment. Message sent to scheduling department to call to schedule her appt with Tiffany.

## 2024-02-25 ENCOUNTER — Ambulatory Visit: Payer: Medicare Other | Admitting: Physician Assistant

## 2024-03-27 ENCOUNTER — Encounter: Payer: Self-pay | Admitting: Nurse Practitioner

## 2024-03-27 ENCOUNTER — Ambulatory Visit (INDEPENDENT_AMBULATORY_CARE_PROVIDER_SITE_OTHER): Admitting: Nurse Practitioner

## 2024-03-27 VITALS — BP 110/64 | HR 78 | Ht 63.75 in | Wt 109.0 lb

## 2024-03-27 DIAGNOSIS — Z01419 Encounter for gynecological examination (general) (routine) without abnormal findings: Secondary | ICD-10-CM | POA: Diagnosis not present

## 2024-03-27 DIAGNOSIS — M810 Age-related osteoporosis without current pathological fracture: Secondary | ICD-10-CM

## 2024-03-27 DIAGNOSIS — Z780 Asymptomatic menopausal state: Secondary | ICD-10-CM | POA: Diagnosis not present

## 2024-03-27 NOTE — Progress Notes (Signed)
 Sheryl Huang January 09, 1946 [id number]   History:  78 y.o. G1P10077 y.o. G1P1001 presents for breast and pelvic exam. No GYN complaints. Postmenopausal - no HRT, no bleeding. Osteoporosis, was doing Prolia  but stopped in 2023 per patient request. Wanted to take a break. Normal pap and mammogram history.   Gynecologic History No LMP recorded. Patient is postmenopausal.   Contraception: post menopausal status Sexually active: No  Health Maintenance Last Pap: 11/27/2016. Results were: Normal Last mammogram: 05/28/2023 Results were: Distortion right breast, follow up imaging normal Last colonoscopy: 09/21/2023 Last Dexa: 03/29/2022. Results were: T-score -3.7  Past medical history, past surgical history, family history and social history were all reviewed and documented in the EPIC chart. Brother with history of colon cancer. Widowed. Daughter in [place], 27 yo grandson diagnosed with T1DM at age 67. and 19 yo granddaughter. Active in church, exercises often.   ROS:  A ROS was performed and pertinent positives and negatives are included.  Exam:  Vitals:   03/27/24 0942  BP: 110/64  Pulse: 78  SpO2: 97%  Weight: 109 lb (49.4 kg)  Height: 5' 3.75" (1.619 m)    Body mass index is 18.86 kg/m.  General appearance:  Normal Thyroid :  Symmetrical, normal in size, without palpable masses or nodularity. Respiratory  Auscultation:  Clear without wheezing or rhonchi Cardiovascular  Auscultation:  Regular rate, without rubs, murmurs or gallops  Edema/varicosities:  Not grossly evident Abdominal  Soft,nontender, without masses, guarding or rebound.  Liver/spleen:  No organomegaly noted  Hernia:  None appreciated  Skin  Inspection:  Grossly normal Breasts: Examined lying and sitting.   Right: Without masses, retractions, nipple discharge or axillary adenopathy.   Left: Without masses, retractions, nipple discharge or axillary adenopathy. Pelvic: External genitalia:  no lesions              Urethra:   normal appearing urethra with no masses, tenderness or lesions              Bartholins and Skenes: normal                 Vagina: normal appearing vagina with normal color and discharge, no lesions. Atrophic changes              Cervix: no lesions Bimanual Exam:  Uterus:  no masses or tenderness              Adnexa: no mass, fullness, tenderness              Rectovaginal: Deferred              Anus:  normal, no lesions  Patient informed chaperone available to be present for breast and pelvic exam. Patient has requested no chaperone to be present. Patient has been advised what will be completed during breast and pelvic exam.   Assessment/Plan:  78 y.o. G1P1001 for breast and pelvic exam.   Encounter for breast and pelvic examination - Education provided on SBEs, importance of preventative screenings, current guidelines, high calcium  diet, regular exercise, and multivitamin daily. Labs with PCP.   Age-related osteoporosis without current pathological fracture - 02/2022 T-score -3.7. was doing Prolia  2018-2023. Patient wanted to take a break. Taking daily vitamin D  + calcium  supplement and walking regularly. Will schedule DXA at [facility]. Briefly discussed Evenity.   Postmenopausal - no HRT, no bleeding.   Screening for cervical cancer - Normal Pap history. No longer screening per guidelines.   Screening for breast cancer - Normal mammogram history.  Continue  annual screenings.  Normal breast exam today.  Screening for colon cancer - 2024 colonoscopy. Screenings no longer recommended.    Return in 2 years for breast and pelvic exam.    Andee Bamberger DNP, 10:12 AM 03/27/2024

## 2024-04-03 DIAGNOSIS — N958 Other specified menopausal and perimenopausal disorders: Secondary | ICD-10-CM | POA: Diagnosis not present

## 2024-04-03 DIAGNOSIS — E2839 Other primary ovarian failure: Secondary | ICD-10-CM | POA: Diagnosis not present

## 2024-04-03 DIAGNOSIS — M8588 Other specified disorders of bone density and structure, other site: Secondary | ICD-10-CM | POA: Diagnosis not present

## 2024-04-03 LAB — HM DEXA SCAN

## 2024-04-08 ENCOUNTER — Ambulatory Visit: Payer: Self-pay | Admitting: Nurse Practitioner

## 2024-04-08 ENCOUNTER — Encounter: Payer: Self-pay | Admitting: Nurse Practitioner

## 2024-04-10 ENCOUNTER — Ambulatory Visit: Payer: Medicare Other | Admitting: Family Medicine

## 2024-04-10 ENCOUNTER — Encounter: Payer: Self-pay | Admitting: Family Medicine

## 2024-04-10 VITALS — BP 116/60 | HR 73 | Temp 97.7°F | Ht 63.75 in | Wt 109.4 lb

## 2024-04-10 DIAGNOSIS — G629 Polyneuropathy, unspecified: Secondary | ICD-10-CM | POA: Diagnosis not present

## 2024-04-10 DIAGNOSIS — R5383 Other fatigue: Secondary | ICD-10-CM

## 2024-04-10 DIAGNOSIS — E7800 Pure hypercholesterolemia, unspecified: Secondary | ICD-10-CM

## 2024-04-10 DIAGNOSIS — Z0000 Encounter for general adult medical examination without abnormal findings: Secondary | ICD-10-CM

## 2024-04-10 MED ORDER — ROSUVASTATIN CALCIUM 10 MG PO TABS: 10.0000 mg | ORAL_TABLET | Freq: Every day | ORAL | 3 refills | Status: AC

## 2024-04-10 NOTE — Progress Notes (Signed)
 Subjective:    Patient ID: Sheryl Huang, female    DOB: 01-Feb-1946, 78 y.o.   MRN: [id number]  HPI  Patient is a very sweet 78 year old Caucasian female here today for a physical exam.   She is a widow.  She lives alone but a family member checks on her every day.  She is still active and driving.  She does have osteoporosis which had worsened on dexa from 6/25.  Planned to begin evenity soon with gyn.  Mammogram is due in August.  Colonoscopy in 2024 is utd.  Pap is not due.  Does report fatigue along with hyperthesias in both feet.  Denies memory loss or falls.  Immunization History  Administered Date(s) Administered   Fluad Quad(high Dose 65+) 08/06/2019, 08/11/2020, 08/07/2022   Influenza, High Dose Seasonal PF 07/27/2014, 08/01/2017, 07/27/2018, 08/10/2021, 08/13/2023   Influenza,inj,quad, With Preservative 07/30/2017   Influenza-Unspecified 07/30/2015   PFIZER Comirnaty(Gray Top)Covid-19 Tri-Sucrose Vaccine 04/03/2021   PFIZER(Purple Top)SARS-COV-2 Vaccination 09/20/2020   Pfizer Covid-19 Vaccine Bivalent Booster 48yrs & up 11/01/2021   Pfizer(Comirnaty)Fall Seasonal Vaccine 12 years and older 08/20/2023   Pneumococcal Conjugate-13 10/07/2015   Pneumococcal Polysaccharide-23 10/05/2017   Tdap 10/07/2015   Zoster Recombinant(Shingrix) 01/07/2022, 05/15/2022   Zoster, Live 11/13/2012    Past Medical History:  Diagnosis Date   Allergy    Anemia    Cataract    removed both eyes   GERD (gastroesophageal reflux disease)    History of shingles    Hyperlipidemia    Osteoporosis 11/2016   T score 3.8   Postmenopausal 02/11/2020   Past Surgical History:  Procedure Laterality Date   APPENDECTOMY  1969   BREAST BIOPSY  2001   left and right   CATARACT EXTRACTION, BILATERAL     COLONOSCOPY  2014   neg   OVARIAN CYST REMOVAL  1995   TUBAL LIGATION  1992   UPPER GI ENDOSCOPY     Current Outpatient Medications on File Prior to Visit  Medication Sig Dispense Refill    Calcium  Carbonate-Vitamin D  (CALCIUM  600 + D PO) Take 1 tablet by mouth 2 (two) times daily.     cholecalciferol (VITAMIN D ) 1000 UNITS tablet Take 1,000 Units by mouth daily.     dicyclomine  (BENTYL ) 10 MG capsule Take 1 capsule (10 mg total) by mouth daily as needed for spasms. 30 capsule 3   fluticasone  (CUTIVATE ) 0.05 % cream as needed.     fluticasone  (FLONASE ) 50 MCG/ACT nasal spray Place 2 sprays into both nostrils daily. 16 g 6   pantoprazole  (PROTONIX ) 40 MG tablet Take 1 tablet (40 mg total) by mouth daily. 30 tablet 6   polyethylene glycol-electrolytes (NULYTELY) 420 g solution Take 4,000 mLs by mouth once.     POTASSIUM PO Take 1 tablet by mouth daily.     levocetirizine (XYZAL ) 5 MG tablet TAKE 1 TABLET BY MOUTH EVERY DAY IN THE EVENING (Patient not taking: Reported on 04/10/2024) 90 tablet 1   No current facility-administered medications on file prior to visit.   Allergies  Allergen Reactions   Erythromycin     REACTION: SEVERE GI UPSET   Social History   Socioeconomic History   Marital status: Widowed    Spouse name: Not on file   Number of children: Not on file   Years of education: Not on file   Highest education level: 12th grade  Occupational History   Not on file  Tobacco Use   Smoking status: Never  Smokeless tobacco: Never  Vaping Use   Vaping status: Never Used  Substance and Sexual Activity   Alcohol use: No    Alcohol/week: 0.0 standard drinks of alcohol   Drug use: No   Sexual activity: Not Currently    Birth control/protection: Post-menopausal, Surgical    Comment: BTL-1st intercourse 78 yo-Fewer than 5 partners  Other Topics Concern   Not on file  Social History Narrative   Not on file   Social Drivers of Health   Financial Resource Strain: Low Risk  (09/17/2023)   Overall Financial Resource Strain (CARDIA)    Difficulty of Paying Living Expenses: Not hard at all  Food Insecurity: No Food Insecurity (09/17/2023)   Hunger Vital Sign     Worried About Running Out of Food in the Last Year: Never true    Ran Out of Food in the Last Year: Never true  Transportation Needs: No Transportation Needs (09/17/2023)   PRAPARE - Administrator, Civil Service (Medical): No    Lack of Transportation (Non-Medical): No  Physical Activity: Sufficiently Active (09/17/2023)   Exercise Vital Sign    Days of Exercise per Week: 3 days    Minutes of Exercise per Session: 50 min  Stress: No Stress Concern Present (09/17/2023)   [employer] of Occupational Health - Occupational Stress Questionnaire    Feeling of Stress : Only a little  Social Connections: Moderately Integrated (09/17/2023)   Social Connection and Isolation Panel    Frequency of Communication with Friends and Family: More than three times a week    Frequency of Social Gatherings with Friends and Family: Once a week    Attends Religious Services: More than 4 times per year    Active Member of [employer] or Organizations: Yes    Attends Banker Meetings: 1 to 4 times per year    Marital Status: Widowed  Intimate Partner Violence: Not on file    Review of Systems  All other systems reviewed and are negative.      Objective:   Physical Exam Vitals reviewed.  Constitutional:      General: She is not in acute distress.    Appearance: Normal appearance. She is normal weight. She is not ill-appearing or toxic-appearing.  HENT:     Mouth/Throat:     Mouth: Mucous membranes are moist.     Pharynx: No oropharyngeal exudate or posterior oropharyngeal erythema.   Cardiovascular:     Rate and Rhythm: Normal rate and regular rhythm.     Heart sounds: Normal heart sounds.  Pulmonary:     Effort: Pulmonary effort is normal. No respiratory distress.     Breath sounds: Normal breath sounds. No stridor. No wheezing, rhonchi or rales.  Abdominal:     General: Abdomen is flat. Bowel sounds are normal. There is no distension.     Palpations: Abdomen is soft.      Tenderness: There is no abdominal tenderness. There is no guarding or rebound.     Hernia: No hernia is present.   Musculoskeletal:     Cervical back: Neck supple. No rigidity.  Lymphadenopathy:     Cervical: No cervical adenopathy.   Neurological:     Mental Status: She is alert.           Assessment & Plan:  Neuropathy - Plan: CBC with Differential/Platelet, Comprehensive metabolic panel with GFR, Lipid panel, TSH, Vitamin B12  Pure hypercholesterolemia - Plan: Lipid panel  Fatigue, unspecified type - Plan:  TSH, Vitamin B12  Elevated serum cholesterol - Plan: rosuvastatin  (CRESTOR ) 10 MG tablet  General medical exam Agree 100% with evenity.  BP is excellent.  Check cbc, cmp, flp, tsh, b12.  Suspect she is developing neuropathy in feel.  Pulses in both feet are excellent.  No PVD.  Mammogram due in August.  Colonoscopy and pap are utd.

## 2024-04-11 ENCOUNTER — Ambulatory Visit: Payer: Self-pay | Admitting: Family Medicine

## 2024-04-11 LAB — COMPREHENSIVE METABOLIC PANEL WITH GFR
AG Ratio: 1.8 (calc) (ref 1.0–2.5)
ALT: 8 U/L (ref 6–29)
AST: 18 U/L (ref 10–35)
Albumin: 4.3 g/dL (ref 3.6–5.1)
Alkaline phosphatase (APISO): 68 U/L (ref 37–153)
BUN: 14 mg/dL (ref 7–25)
CO2: 29 mmol/L (ref 20–32)
Calcium: 9.9 mg/dL (ref 8.6–10.4)
Chloride: 103 mmol/L (ref 98–110)
Creat: 0.79 mg/dL (ref 0.60–1.00)
Globulin: 2.4 g/dL (ref 1.9–3.7)
Glucose, Bld: 78 mg/dL (ref 65–99)
Potassium: 4.2 mmol/L (ref 3.5–5.3)
Sodium: 141 mmol/L (ref 135–146)
Total Bilirubin: 0.8 mg/dL (ref 0.2–1.2)
Total Protein: 6.7 g/dL (ref 6.1–8.1)
eGFR: 77 mL/min/{1.73_m2} (ref >=60)

## 2024-04-11 LAB — CBC WITH DIFFERENTIAL/PLATELET
Absolute Lymphocytes: 1720 {cells}/uL (ref 850–3900)
Absolute Monocytes: 561 {cells}/uL (ref 200–950)
Basophils Absolute: 32 {cells}/uL (ref 0–200)
Basophils Relative: 0.5 %
Eosinophils Absolute: 132 {cells}/uL (ref 15–500)
Eosinophils Relative: 2.1 %
HCT: 40.9 % (ref 35.0–45.0)
Hemoglobin: 13.1 g/dL (ref 11.7–15.5)
MCH: 30.4 pg (ref 27.0–33.0)
MCHC: 32 g/dL (ref 32.0–36.0)
MCV: 94.9 fL (ref 80.0–100.0)
MPV: 11.6 fL (ref 7.5–12.5)
Monocytes Relative: 8.9 %
Neutro Abs: 3856 {cells}/uL (ref 1500–7800)
Neutrophils Relative %: 61.2 %
Platelets: 211 10*3/uL (ref 140–400)
RBC: 4.31 10*6/uL (ref 3.80–5.10)
RDW: 12.6 % (ref 11.0–15.0)
Total Lymphocyte: 27.3 %
WBC: 6.3 10*3/uL (ref 3.8–10.8)

## 2024-04-11 LAB — LIPID PANEL
Cholesterol: 166 mg/dL (ref <200)
HDL: 58 mg/dL (ref >=50)
LDL Cholesterol (Calc): 93 mg/dL
Non-HDL Cholesterol (Calc): 108 mg/dL (ref <130)
Total CHOL/HDL Ratio: 2.9 (calc) (ref <5.0)
Triglycerides: 60 mg/dL (ref <150)

## 2024-04-11 LAB — VITAMIN B12: Vitamin B-12: 731 pg/mL (ref 200–1100)

## 2024-04-11 LAB — TSH: TSH: 4.1 m[IU]/L (ref 0.40–4.50)

## 2024-04-14 NOTE — Telephone Encounter (Signed)
 Please start PA for Evenity. Thanks.

## 2024-04-17 ENCOUNTER — Encounter: Payer: Self-pay | Admitting: Physician Assistant

## 2024-04-17 ENCOUNTER — Ambulatory Visit: Admitting: Physician Assistant

## 2024-04-17 VITALS — BP 104/65 | HR 73 | Ht 63.75 in | Wt 110.0 lb

## 2024-04-17 DIAGNOSIS — K5900 Constipation, unspecified: Secondary | ICD-10-CM | POA: Diagnosis not present

## 2024-04-17 DIAGNOSIS — K219 Gastro-esophageal reflux disease without esophagitis: Secondary | ICD-10-CM

## 2024-04-17 DIAGNOSIS — K5904 Chronic idiopathic constipation: Secondary | ICD-10-CM

## 2024-04-17 MED ORDER — PANTOPRAZOLE SODIUM 40 MG PO TBEC: 40.0000 mg | DELAYED_RELEASE_TABLET | Freq: Every day | ORAL | 3 refills | Status: AC

## 2024-04-17 NOTE — Addendum Note (Signed)
 Addended by: Eliane Grooms T on: 04/17/2024 06:44 AM   Modules accepted: Level of Service

## 2024-04-17 NOTE — Progress Notes (Signed)
 Chief Complaint: Follow up GERD  HPI:    Sheryl Huang is a 78 year old female with a past medical history as listed below including GERD, osteoporosis and multiple others, known to Dr. Leonia Raman, who presents to clinic today for follow-up of GERD and constipation.    11/14/2022 patient seen in clinic by Dr. Nandigam at that time patient had significant improvement after EGD with dilation and GERD symptoms were stable.  At that point recommend to continue Pantoprazole , decrease to 20 mg daily.  Told to use Dicyclomine  10 mg every 8 hours for lower abdominal cramping and bloating.,  Discussed recall colonoscopy in November.    04/10/2024 CBC, CMP, lipid panel, TSH and vitamin B12 all normal.    Today, patient tells me she is just here for refills of her Pantoprazole  40 mg.  She takes this daily and hardly ever has breakthrough symptoms.  She would like to continue it.  Also continues Benefiber for constipation which works well for her.  Very occasionally she will use a dose of MiraLAX, but in general feels good and healthy.  Reminds me she did do a trial of Pantoprazole  20 mg daily but this did not work well for her.    Denies fever, chills or weight loss.  GI history: 09/21/2023 colonoscopy with a 7 mm polyp in the ascending colon, diverticulosis in the sigmoid and descending colon nonbleeding external and internal hemorrhoids-no repeat due to age  EGD May 19, 2020: Small hiatal hernia.  Gastritis and duodenitis.  Benign esophageal stricture s/p dilation to 18 mm   CT abd & pelvis 04/06/20: Circumferential distal esophageal wall thickening. Large colonic stool burden   Colonoscopy 07/10/2018: 7 mm polyp [tubular adenoma], sigmoid diverticulosis, AVM in sigmoid colon.    EGD 02/26/1997 + H.pylori, unable to review the rest of the report as the handwritten scanned document is not very legible   Family history of colon cancer in her brother in his 75's Past Medical History:  Diagnosis Date    Allergy    Anemia    Cataract    removed both eyes   GERD (gastroesophageal reflux disease)    History of shingles    Hyperlipidemia    Osteoporosis 11/2016   T score 3.8   Postmenopausal 02/11/2020    Past Surgical History:  Procedure Laterality Date   APPENDECTOMY  1969   BREAST BIOPSY  2001   left and right   CATARACT EXTRACTION, BILATERAL     COLONOSCOPY  2014   neg   OVARIAN CYST REMOVAL  1995   TUBAL LIGATION  1992   UPPER GI ENDOSCOPY      Current Outpatient Medications  Medication Sig Dispense Refill   Calcium  Carbonate-Vitamin D  (CALCIUM  600 + D PO) Take 1 tablet by mouth 2 (two) times daily.     cholecalciferol (VITAMIN D ) 1000 UNITS tablet Take 1,000 Units by mouth daily.     dicyclomine  (BENTYL ) 10 MG capsule Take 1 capsule (10 mg total) by mouth daily as needed for spasms. 30 capsule 3   fluticasone  (CUTIVATE ) 0.05 % cream as needed.     fluticasone  (FLONASE ) 50 MCG/ACT nasal spray Place 2 sprays into both nostrils daily. 16 g 6   levocetirizine (XYZAL ) 5 MG tablet TAKE 1 TABLET BY MOUTH EVERY DAY IN THE EVENING (Patient not taking: Reported on 04/10/2024) 90 tablet 1   pantoprazole  (PROTONIX ) 40 MG tablet Take 1 tablet (40 mg total) by mouth daily. 30 tablet 6   polyethylene glycol-electrolytes (NULYTELY)  420 g solution Take 4,000 mLs by mouth once.     POTASSIUM PO Take 1 tablet by mouth daily.     rosuvastatin  (CRESTOR ) 10 MG tablet Take 1 tablet (10 mg total) by mouth daily. 90 tablet 3   No current facility-administered medications for this visit.    Allergies as of 04/17/2024 - Review Complete 04/10/2024  Allergen Reaction Noted   Erythromycin      Family History  Problem Relation Age of Onset   Hypertension Mother    Stroke Mother 34   Hypertension Father    Heart attack Father    Colon cancer Brother    Heart attack Paternal Grandmother        ? age   Heart attack Maternal Uncle    Coronary artery disease Paternal Aunt    Coronary artery  disease Paternal Uncle    Heart attack Maternal Grandfather    Diabetes Neg Hx    Colon polyps Neg Hx    Esophageal cancer Neg Hx    Rectal cancer Neg Hx    Stomach cancer Neg Hx     Social History   Socioeconomic History   Marital status: Widowed    Spouse name: Not on file   Number of children: Not on file   Years of education: Not on file   Highest education level: 12th grade  Occupational History   Not on file  Tobacco Use   Smoking status: Never   Smokeless tobacco: Never  Vaping Use   Vaping status: Never Used  Substance and Sexual Activity   Alcohol use: No    Alcohol/week: 0.0 standard drinks of alcohol   Drug use: No   Sexual activity: Not Currently    Birth control/protection: Post-menopausal, Surgical    Comment: BTL-1st intercourse 78 yo-Fewer than 5 partners  Other Topics Concern   Not on file  Social History Narrative   Not on file   Social Drivers of Health   Financial Resource Strain: Low Risk  (09/17/2023)   Overall Financial Resource Strain (CARDIA)    Difficulty of Paying Living Expenses: Not hard at all  Food Insecurity: No Food Insecurity (09/17/2023)   Hunger Vital Sign    Worried About Running Out of Food in the Last Year: Never true    Ran Out of Food in the Last Year: Never true  Transportation Needs: No Transportation Needs (09/17/2023)   PRAPARE - Administrator, Civil Service (Medical): No    Lack of Transportation (Non-Medical): No  Physical Activity: Sufficiently Active (09/17/2023)   Exercise Vital Sign    Days of Exercise per Week: 3 days    Minutes of Exercise per Session: 50 min  Stress: No Stress Concern Present (09/17/2023)   [employer] of Occupational Health - Occupational Stress Questionnaire    Feeling of Stress : Only a little  Social Connections: Moderately Integrated (09/17/2023)   Social Connection and Isolation Panel    Frequency of Communication with Friends and Family: More than three times a  week    Frequency of Social Gatherings with Friends and Family: Once a week    Attends Religious Services: More than 4 times per year    Active Member of [employer] or Organizations: Yes    Attends Banker Meetings: 1 to 4 times per year    Marital Status: Widowed  Intimate Partner Violence: Not on file    Review of Systems:    Constitutional: No weight loss, fever or chills  Cardiovascular: No chest pain Respiratory: No SOB  Gastrointestinal: See HPI and otherwise negative   Physical Exam:  Vital signs: BP 104/65   Pulse 73   Ht 5' 3.75 (1.619 m)   Wt 110 lb (49.9 kg)   BMI 19.03 kg/m    Constitutional:   Pleasant elderly Caucasian female appears to be in NAD, Well developed, Well nourished, alert and cooperative  Respiratory: Respirations even and unlabored. Lungs clear to auscultation bilaterally.   No wheezes, crackles, or rhonchi.  Cardiovascular: Normal S1, S2. No MRG. Regular rate and rhythm. No peripheral edema, cyanosis or pallor.  Gastrointestinal:  Soft, nondistended, nontender. No rebound or guarding. Normal bowel sounds. No appreciable masses or hepatomegaly. Psychiatric: Oriented to person, place and time. Demonstrates good judgement and reason without abnormal affect or behaviors.  RELEVANT LABS AND IMAGING: CBC    Component Value Date/Time   WBC 6.3 04/10/2024 0956   RBC 4.31 04/10/2024 0956   HGB 13.1 04/10/2024 0956   HCT 40.9 04/10/2024 0956   PLT 211 04/10/2024 0956   MCV 94.9 04/10/2024 0956   MCH 30.4 04/10/2024 0956   MCHC 32.0 04/10/2024 0956   RDW 12.6 04/10/2024 0956   LYMPHSABS 1,988 04/09/2023 1049   MONOABS 0.5 04/06/2014 0949   EOSABS 132 04/10/2024 0956   BASOSABS 32 04/10/2024 0956    CMP     Component Value Date/Time   NA 141 04/10/2024 0956   K 4.2 04/10/2024 0956   CL 103 04/10/2024 0956   CO2 29 04/10/2024 0956   GLUCOSE 78 04/10/2024 0956   BUN 14 04/10/2024 0956   CREATININE 0.79 04/10/2024 0956   CALCIUM  9.9  04/10/2024 0956   PROT 6.7 04/10/2024 0956   ALBUMIN 4.4 10/07/2015 1431   AST 18 04/10/2024 0956   ALT 8 04/10/2024 0956   ALKPHOS 69 10/07/2015 1431   BILITOT 0.8 04/10/2024 0956   GFRNONAA 82 03/16/2020 0914   GFRAA 95 03/16/2020 0914    Assessment: 1.  GERD: Controlled on Pantoprazole  40 mg daily 2.  Constipation: Controlled on Benefiber/MiraLAX.  Plan: 1.  Refill Pantoprazole  40 mg daily #90 with 3 refills.  Patient can have a refill for another year if she is doing well 2.  Continue Benefiber/MiraLAX for constipation 3.  Patient to follow in clinic with us  as needed or in 2 years for further refills.  Reginal Capra, PA-C [facility] 04/17/2024, 2:41 PM  Cc: Austine Lefort, MD

## 2024-04-17 NOTE — Patient Instructions (Signed)
 Follow up in 2 years or as needed.  Thank you for trusting me with your gastrointestinal care!  Reginal Capra, PA-C  _______________________________________________________  If your blood pressure at your visit was 140/90 or greater, please contact your primary care physician to follow up on this.  _______________________________________________________  If you are age 77 or older, your body mass index should be between 23-30. Your Body mass index is 19.03 kg/m. If this is out of the aforementioned range listed, please consider follow up with your Primary Care Provider.  If you are age 54 or younger, your body mass index should be between 19-25. Your Body mass index is 19.03 kg/m. If this is out of the aformentioned range listed, please consider follow up with your Primary Care Provider.   ________________________________________________________  The [facility] providers would like to encourage you to use MYCHART to communicate with providers for non-urgent requests or questions.  Due to long hold times on the telephone, sending your provider a message by [facility] may be a faster and more efficient way to get a response.  Please allow 48 business hours for a response.  Please remember that this is for non-urgent requests.  _______________________________________________________

## 2024-04-30 ENCOUNTER — Other Ambulatory Visit: Payer: Self-pay | Admitting: *Deleted

## 2024-04-30 DIAGNOSIS — M810 Age-related osteoporosis without current pathological fracture: Secondary | ICD-10-CM

## 2024-04-30 MED ORDER — ROMOSOZUMAB-AQQG 105 MG/1.17ML ~~LOC~~ SOSY
210.0000 mg | PREFILLED_SYRINGE | Freq: Once | SUBCUTANEOUS | Status: AC
  Administered 2024-05-15: 210 mg via SUBCUTANEOUS

## 2024-05-06 ENCOUNTER — Other Ambulatory Visit: Payer: Self-pay | Admitting: *Deleted

## 2024-05-06 DIAGNOSIS — M810 Age-related osteoporosis without current pathological fracture: Secondary | ICD-10-CM

## 2024-05-06 MED ORDER — ROMOSOZUMAB-AQQG 105 MG/1.17ML ~~LOC~~ SOSY
210.0000 mg | PREFILLED_SYRINGE | SUBCUTANEOUS | Status: AC
  Administered 2024-06-16 – 2024-10-20 (×5): 210 mg via SUBCUTANEOUS

## 2024-05-15 ENCOUNTER — Ambulatory Visit (INDEPENDENT_AMBULATORY_CARE_PROVIDER_SITE_OTHER)

## 2024-05-15 DIAGNOSIS — M810 Age-related osteoporosis without current pathological fracture: Secondary | ICD-10-CM

## 2024-05-15 NOTE — Progress Notes (Signed)
 Evenity  injections given SQ left arm and SQ right arm.  Patient tolerated injections well.   Annual exam: 03/27/24 TW Calcium :      9.9     Date: 04/10/24 Upcoming dental procedures: no Hx of Kidney Disease: no Hx of heart attack or stroke in the last year: no Last Bone Density Scan: 04/03/24

## 2024-06-04 DIAGNOSIS — Z1231 Encounter for screening mammogram for malignant neoplasm of breast: Secondary | ICD-10-CM | POA: Diagnosis not present

## 2024-06-04 LAB — HM MAMMOGRAPHY

## 2024-06-16 ENCOUNTER — Ambulatory Visit (INDEPENDENT_AMBULATORY_CARE_PROVIDER_SITE_OTHER)

## 2024-06-16 DIAGNOSIS — M810 Age-related osteoporosis without current pathological fracture: Secondary | ICD-10-CM | POA: Diagnosis not present

## 2024-06-25 ENCOUNTER — Ambulatory Visit

## 2024-06-25 VITALS — Ht 63.0 in | Wt 110.0 lb

## 2024-06-25 DIAGNOSIS — Z0000 Encounter for general adult medical examination without abnormal findings: Secondary | ICD-10-CM

## 2024-06-25 NOTE — Progress Notes (Signed)
 Subjective:   Sheryl Huang is a 78 y.o. who presents for a Medicare Wellness preventive visit.  As a reminder, Annual Wellness Visits don't include a physical exam, and some assessments may be limited, especially if this visit is performed virtually. We may recommend an in-person follow-up visit with your provider if needed.  Visit Complete: Virtual I connected with  Sheryl Huang on 06/25/24 by a audio enabled telemedicine application and verified that I am speaking with the correct person using two identifiers.  Patient Location: Home  Provider Location: Home Office  I discussed the limitations of evaluation and management by telemedicine. The patient expressed understanding and agreed to proceed.  Vital Signs: Because this visit was a virtual/telehealth visit, some criteria may be missing or patient reported. Any vitals not documented were not able to be obtained and vitals that have been documented are patient reported.  VideoDeclined- This patient declined Librarian, academic. Therefore the visit was completed with audio only.  Persons Participating in Visit: Patient.  AWV Questionnaire: No: Patient Medicare AWV questionnaire was not completed prior to this visit.  Cardiac Risk Factors include: advanced age (>26men, >59 women);dyslipidemia     Objective:    Today's Vitals   06/25/24 0932  Weight: 110 lb (49.9 kg)  Height: 5' 3 (1.6 m)   Body mass index is 19.49 kg/m.     06/25/2024    9:38 AM  Advanced Directives  Does Patient Have a Medical Advance Directive? No  Would patient like information on creating a medical advance directive? Yes (MAU/Ambulatory/Procedural Areas - Information given)    Current Medications (verified) Outpatient Encounter Medications as of 06/25/2024  Medication Sig   Calcium  Carbonate-Vitamin D  (CALCIUM  600 + D PO) Take 1 tablet by mouth 2 (two) times daily.   cholecalciferol (VITAMIN D ) 1000 UNITS tablet  Take 1,000 Units by mouth daily.   dicyclomine  (BENTYL ) 10 MG capsule Take 1 capsule (10 mg total) by mouth daily as needed for spasms.   fluticasone  (CUTIVATE ) 0.05 % cream as needed.   fluticasone  (FLONASE ) 50 MCG/ACT nasal spray Place 2 sprays into both nostrils daily.   levocetirizine (XYZAL ) 5 MG tablet TAKE 1 TABLET BY MOUTH EVERY DAY IN THE EVENING   pantoprazole  (PROTONIX ) 40 MG tablet Take 1 tablet (40 mg total) by mouth daily.   polyethylene glycol-electrolytes (NULYTELY) 420 g solution Take 4,000 mLs by mouth once.   POTASSIUM PO Take 1 tablet by mouth daily.   rosuvastatin  (CRESTOR ) 10 MG tablet Take 1 tablet (10 mg total) by mouth daily.   Facility-Administered Encounter Medications as of 06/25/2024  Medication   Romosozumab -aqqg (EVENITY ) 105 MG/1. injection 210 mg    Allergies (verified) Erythromycin   History: Past Medical History:  Diagnosis Date   Allergy    Anemia    Cataract    removed both eyes   GERD (gastroesophageal reflux disease)    History of shingles    Hyperlipidemia    Osteoporosis 11/2016   T score 3.8   Postmenopausal 02/11/2020   Past Surgical History:  Procedure Laterality Date   APPENDECTOMY  1969   BREAST BIOPSY  2001   left and right   CATARACT EXTRACTION, BILATERAL     COLONOSCOPY  2014   neg   OVARIAN CYST REMOVAL  1995   TUBAL LIGATION  1992   UPPER GI ENDOSCOPY     Family History  Problem Relation Age of Onset   Hypertension Mother    Stroke  Mother 70   Hypertension Father    Heart attack Father    Colon cancer Brother    Heart attack Paternal Grandmother        ? age   Heart attack Maternal Uncle    Coronary artery disease Paternal Aunt    Coronary artery disease Paternal Uncle    Heart attack Maternal Grandfather    Diabetes Neg Hx    Colon polyps Neg Hx    Esophageal cancer Neg Hx    Rectal cancer Neg Hx    Stomach cancer Neg Hx    Social History   Socioeconomic History   Marital status: Widowed     Spouse name: Not on file   Number of children: Not on file   Years of education: Not on file   Highest education level: 12th grade  Occupational History   Not on file  Tobacco Use   Smoking status: Never   Smokeless tobacco: Never  Vaping Use   Vaping status: Never Used  Substance and Sexual Activity   Alcohol use: No    Alcohol/week: 0.0 standard drinks of alcohol   Drug use: No   Sexual activity: Not Currently    Birth control/protection: Post-menopausal, Surgical    Comment: BTL-1st intercourse 78 yo-Fewer than 5 partners  Other Topics Concern   Not on file  Social History Narrative   Not on file   Social Drivers of Health   Financial Resource Strain: Low Risk  (06/25/2024)   Overall Financial Resource Strain (CARDIA)    Difficulty of Paying Living Expenses: Not hard at all  Food Insecurity: No Food Insecurity (06/25/2024)   Hunger Vital Sign    Worried About Running Out of Food in the Last Year: Never true    Ran Out of Food in the Last Year: Never true  Transportation Needs: No Transportation Needs (06/25/2024)   PRAPARE - Administrator, Civil Service (Medical): No    Lack of Transportation (Non-Medical): No  Physical Activity: Sufficiently Active (06/25/2024)   Exercise Vital Sign    Days of Exercise per Week: 3 days    Minutes of Exercise per Session: 50 min  Stress: No Stress Concern Present (06/25/2024)   [employer] of Occupational Health - Occupational Stress Questionnaire    Feeling of Stress: Not at all  Social Connections: Moderately Integrated (06/25/2024)   Social Connection and Isolation Panel    Frequency of Communication with Friends and Family: More than three times a week    Frequency of Social Gatherings with Friends and Family: Once a week    Attends Religious Services: More than 4 times per year    Active Member of [employer] or Organizations: Yes    Attends Banker Meetings: 1 to 4 times per year    Marital Status: Widowed     Tobacco Counseling Counseling given: Not Answered    Clinical Intake:  Pre-visit preparation completed: Yes  Pain : No/denies pain  Diabetes: No   How often do you need to have someone help you when you read instructions, pamphlets, or other written materials from your doctor or pharmacy?: 1 - Never  Interpreter Needed?: No  Information entered by :: Charmaine Bloodgood LPN   Activities of Daily Living     06/25/2024    9:38 AM  In your present state of health, do you have any difficulty performing the following activities:  Hearing? 0  Vision? 0  Difficulty concentrating or making decisions? 0  Walking  or climbing stairs? 0  Dressing or bathing? 0  Doing errands, shopping? 0  Preparing Food and eating ? N  Using the Toilet? N  In the past six months, have you accidently leaked urine? N  Do you have problems with loss of bowel control? N  Managing your Medications? N  Managing your Finances? N  Housekeeping or managing your Housekeeping? N    Patient Care Team: Duanne Butler DASEN, MD as PCP - General (Family Medicine) [facility] Prentiss Annabella LABOR, NP as Nurse Practitioner (Gynecology)  I have updated your Care Teams any recent Medical Services you may have received from other providers in the past year.     Assessment:   This is a routine wellness examination for Sheryl Huang.  Hearing/Vision screen Hearing Screening - Comments:: Denies hearing difficulties   Vision Screening - Comments:: Wears rx glasses - up to date with routine eye exams with [facility]    Goals Addressed             This Visit's Progress    Maintain health and independence   On track      Depression Screen     06/25/2024    9:37 AM 04/10/2024    9:36 AM 09/17/2023    3:13 PM 04/09/2023   10:57 AM 04/09/2023   10:17 AM 09/14/2017   11:02 AM 10/07/2015    1:54 PM  PHQ 2/9 Scores  PHQ - 2 Score 0 0 0 0 0 0 0  PHQ- 9 Score  2         Fall Risk      06/25/2024    9:38 AM 04/10/2024    9:36 AM 03/27/2024    9:52 AM 09/17/2023    3:13 PM 04/09/2023   10:56 AM  Fall Risk   Falls in the past year? 0 0 0 0 0  Number falls in past yr: 0 0 0 0 0  Injury with Fall? 0 0 0 0 0  Risk for fall due to : No Fall Risks No Fall Risks No Fall Risks    Follow up Falls prevention discussed;Education provided;Falls evaluation completed Falls evaluation completed Falls evaluation completed      MEDICARE RISK AT HOME:  Medicare Risk at Home Any stairs in or around the home?: No If so, are there any without handrails?: No Home free of loose throw rugs in walkways, pet beds, electrical cords, etc?: Yes Adequate lighting in your home to reduce risk of falls?: Yes Life alert?: No Use of a cane, walker or w/c?: No Grab bars in the bathroom?: Yes Shower chair or bench in shower?: No Elevated toilet seat or a handicapped toilet?: Yes  TIMED UP AND GO:  Was the test performed?  No  Cognitive Function: 6CIT completed        06/25/2024    9:38 AM  6CIT Screen  What Year? 0 points  What month? 0 points  What time? 0 points  Count back from 20 0 points  Months in reverse 0 points  Repeat phrase 0 points  Total Score 0 points    Immunizations Immunization History  Administered Date(s) Administered   Fluad Quad(high Dose 65+) 08/06/2019, 08/11/2020, 08/07/2022   INFLUENZA, HIGH DOSE SEASONAL PF 07/27/2014, 08/01/2017, 07/27/2018, 08/10/2021, 08/13/2023   Influenza,inj,quad, With Preservative 07/30/2017   Influenza-Unspecified 07/30/2015   PFIZER Comirnaty(Gray Top)Covid-19 Tri-Sucrose Vaccine 04/03/2021   PFIZER(Purple Top)SARS-COV-2 Vaccination 09/20/2020   Pfizer Covid-19 Vaccine Bivalent Booster 78yrs & up  11/01/2021   Pfizer(Comirnaty)Fall Seasonal Vaccine 12 years and older 08/20/2023   Pneumococcal Conjugate-13 10/07/2015   Pneumococcal Polysaccharide-23 10/05/2017   Tdap 10/07/2015   Zoster Recombinant(Shingrix) 01/07/2022, 05/15/2022    Zoster, Live 11/13/2012    Screening Tests Health Maintenance  Topic Date Due   COVID-19 Vaccine (5 - Pfizer risk 2024-25 season) 02/18/2024   INFLUENZA VACCINE  05/30/2024   Medicare Annual Wellness (AWV)  06/25/2025   DTaP/Tdap/Td (2 - Td or Tdap) 10/06/2025   Pneumococcal Vaccine: 50+ Years  Completed   DEXA SCAN  Completed   Hepatitis C Screening  Completed   Zoster Vaccines- Shingrix  Completed   HPV VACCINES  Aged Out   Meningococcal B Vaccine  Aged Out   Colonoscopy  Discontinued    Health Maintenance  Health Maintenance Due  Topic Date Due   COVID-19 Vaccine (5 - Pfizer risk 2024-25 season) 02/18/2024   INFLUENZA VACCINE  05/30/2024   Health Maintenance Items Addressed: Information provided on vaccine recommendations   Additional Screening:  Vision Screening: Recommended annual ophthalmology exams for early detection of glaucoma and other disorders of the eye. Would you like a referral to an eye doctor? No    Dental Screening: Recommended annual dental exams for proper oral hygiene  Community Resource Referral / Chronic Care Management: CRR required this visit?  No   CCM required this visit?  No   Plan:    I have personally reviewed and noted the following in the patient's chart:   Medical and social history Use of alcohol, tobacco or illicit drugs  Current medications and supplements including opioid prescriptions. Patient is not currently taking opioid prescriptions. Functional ability and status Nutritional status Physical activity Advanced directives List of other physicians Hospitalizations, surgeries, and ER visits in previous 12 months Vitals Screenings to include cognitive, depression, and falls Referrals and appointments  In addition, I have reviewed and discussed with patient certain preventive protocols, quality metrics, and best practice recommendations. A written personalized care plan for preventive services as well as general  preventive health recommendations were provided to patient.   Lavelle Pfeiffer [place], [place]   1/72/7974   After Visit Summary: (MyChart) Due to this being a telephonic visit, the after visit summary with patients personalized plan was offered to patient via MyChart   Notes: Nothing significant to report at this time.

## 2024-06-25 NOTE — Patient Instructions (Signed)
 Sheryl Huang , Thank you for taking time out of your busy schedule to complete your Annual Wellness Visit with me. I enjoyed our conversation and look forward to speaking with you again next year. I, as well as your care team,  appreciate your ongoing commitment to your health goals. Please review the following plan we discussed and let me know if I can assist you in the future. Your Game plan/ To Do List     Follow up Visits: We will see or speak with you next year for your Next Medicare AWV with our clinical staff Have you seen your provider in the last 6 months (3 months if uncontrolled diabetes)? Yes  Clinician Recommendations:  Aim for 30 minutes of exercise or brisk walking, 6-8 glasses of water, and 5 servings of fruits and vegetables each day.       This is a list of the screenings recommended for you:  Health Maintenance  Topic Date Due   COVID-19 Vaccine (5 - Pfizer risk 2024-25 season) 02/18/2024   Flu Shot  05/30/2024   Medicare Annual Wellness Visit  06/25/2025   DTaP/Tdap/Td vaccine (2 - Td or Tdap) 10/06/2025   Pneumococcal Vaccine for age over 1  Completed   DEXA scan (bone density measurement)  Completed   Hepatitis C Screening  Completed   Zoster (Shingles) Vaccine  Completed   HPV Vaccine  Aged Out   Meningitis B Vaccine  Aged Out   Colon Cancer Screening  Discontinued    Advanced directives: (ACP Link)Information on Advanced Care Planning can be found at [place]  Secretary of [facility] Advance Health Care Directives Advance Health Care Directives. [email or web address]   Advance Care Planning is important because it:  [x]  Makes sure you receive the medical care that is consistent with your values, goals, and preferences  [x]  It provides guidance to your family and loved ones and reduces their decisional burden about whether or not they are making the right decisions based on your wishes.  Follow the link provided in your after visit summary or read over the paperwork we  have mailed to you to help you started getting your Advance Directives in place. If you need assistance in completing these, please reach out to us  so that we can help you!  See attachments for Preventive Care and Fall Prevention Tips.

## 2024-07-18 ENCOUNTER — Ambulatory Visit (INDEPENDENT_AMBULATORY_CARE_PROVIDER_SITE_OTHER)

## 2024-07-18 DIAGNOSIS — M810 Age-related osteoporosis without current pathological fracture: Secondary | ICD-10-CM | POA: Diagnosis not present

## 2024-07-18 NOTE — Progress Notes (Signed)
 Evenity  #3 - Given one injection in each arm (SQ), as per Pt's request. Pt tolerated injections well. (IC,CCMA).

## 2024-07-31 DIAGNOSIS — Z23 Encounter for immunization: Secondary | ICD-10-CM | POA: Diagnosis not present

## 2024-08-18 ENCOUNTER — Ambulatory Visit (INDEPENDENT_AMBULATORY_CARE_PROVIDER_SITE_OTHER)

## 2024-08-18 DIAGNOSIS — M810 Age-related osteoporosis without current pathological fracture: Secondary | ICD-10-CM | POA: Diagnosis not present

## 2024-08-18 NOTE — Progress Notes (Signed)
 4th Evenity  injection - One injection given in each arm (SQ). IC, CCMA

## 2024-09-02 DIAGNOSIS — Z23 Encounter for immunization: Secondary | ICD-10-CM | POA: Diagnosis not present

## 2024-09-16 DIAGNOSIS — H04123 Dry eye syndrome of bilateral lacrimal glands: Secondary | ICD-10-CM | POA: Diagnosis not present

## 2024-09-16 DIAGNOSIS — H18593 Other hereditary corneal dystrophies, bilateral: Secondary | ICD-10-CM | POA: Diagnosis not present

## 2024-09-16 DIAGNOSIS — Z961 Presence of intraocular lens: Secondary | ICD-10-CM | POA: Diagnosis not present

## 2024-09-16 DIAGNOSIS — H40013 Open angle with borderline findings, low risk, bilateral: Secondary | ICD-10-CM | POA: Diagnosis not present

## 2024-09-16 DIAGNOSIS — H26493 Other secondary cataract, bilateral: Secondary | ICD-10-CM | POA: Diagnosis not present

## 2024-09-19 ENCOUNTER — Ambulatory Visit (INDEPENDENT_AMBULATORY_CARE_PROVIDER_SITE_OTHER)

## 2024-09-19 ENCOUNTER — Ambulatory Visit (HOSPITAL_COMMUNITY)
Admission: EM | Admit: 2024-09-19 | Discharge: 2024-09-19 | Disposition: A | Discharge status: Home / Self Care | Attending: Emergency Medicine | Admitting: Emergency Medicine

## 2024-09-19 ENCOUNTER — Encounter (HOSPITAL_COMMUNITY): Payer: Self-pay

## 2024-09-19 ENCOUNTER — Telehealth: Payer: Self-pay | Admitting: *Deleted

## 2024-09-19 DIAGNOSIS — M810 Age-related osteoporosis without current pathological fracture: Secondary | ICD-10-CM

## 2024-09-19 DIAGNOSIS — N3000 Acute cystitis without hematuria: Secondary | ICD-10-CM

## 2024-09-19 LAB — POCT URINE DIPSTICK
Bilirubin, UA: NEGATIVE
Glucose, UA: NEGATIVE mg/dL
Ketones, POC UA: NEGATIVE mg/dL
Nitrite, UA: POSITIVE — AB
POC PROTEIN,UA: NEGATIVE
Spec Grav, UA: <=1.005 — AB (ref 1.010–1.025)
Urobilinogen, UA: 0.2 U/dL
pH, UA: 5.5 (ref 5.0–8.0)

## 2024-09-19 MED ORDER — CEPHALEXIN 500 MG PO CAPS: 500.0000 mg | ORAL_CAPSULE | Freq: Two times a day (BID) | ORAL | 0 refills | Status: AC

## 2024-09-19 NOTE — ED Provider Notes (Signed)
 [facility]    CSN: [id number] Arrival date & time: 09/19/24  1504      History   Chief Complaint Chief Complaint  Patient presents with   Dysuria    HPI Sheryl Huang is a 78 y.o. female. Pt with dysuria since 09/16/24, not improving. Also with urinary frequency. Deneis abd pain except sometimes has cramping prior to urination. Denies flank pain, n/v, fever/chills. Last uti was several years ago   Dysuria   Past Medical History:  Diagnosis Date   Allergy    Anemia    Cataract    removed both eyes   GERD (gastroesophageal reflux disease)    History of shingles    Hyperlipidemia    Osteoporosis 11/2016   T score 3.8   Postmenopausal 02/11/2020    Patient Active Problem List   Diagnosis Date Noted   Abrasion 03/14/2023   Ceruminosis, bilateral 03/14/2023   Abrasion of ear canal, left, initial encounter 08/09/2022   Impacted cerumen of left ear 08/09/2022   Postmenopausal 02/11/2020   Routine general medical examination at a health care facility 10/09/2015   HYPERLIPIDEMIA 09/13/2010   Senile osteoporosis 04/09/2009   SCOLIOSIS 04/09/2009    Past Surgical History:  Procedure Laterality Date   APPENDECTOMY  1969   BREAST BIOPSY  2001   left and right   CATARACT EXTRACTION, BILATERAL     COLONOSCOPY  2014   neg   OVARIAN CYST REMOVAL  1995   TUBAL LIGATION  1992   UPPER GI ENDOSCOPY      OB History     Gravida  1   Para  1   Term  1   Preterm      AB      Living  1      SAB      IAB      Ectopic      Multiple      Live Births               Home Medications    Prior to Admission medications   Medication Sig Start Date End Date Taking? Authorizing Provider  cephALEXin  (KEFLEX ) 500 MG capsule Take 1 capsule (500 mg total) by mouth 2 (two) times daily for 7 days. 09/19/24 09/26/24 Yes Richad Jon HERO, NP  Calcium  Carbonate-Vitamin D  (CALCIUM  600 + D PO) Take 1 tablet by mouth 2 (two) times daily.    [provider]  cholecalciferol (VITAMIN D ) 1000 UNITS tablet Take 1,000 Units by mouth daily.    [provider]  dicyclomine  (BENTYL ) 10 MG capsule Take 1 capsule (10 mg total) by mouth daily as needed for spasms. 11/14/22   Nandigam, Kavitha V, MD  fluticasone  (CUTIVATE ) 0.05 % cream as needed.    [provider]  pantoprazole  (PROTONIX ) 40 MG tablet Take 1 tablet (40 mg total) by mouth daily. 04/17/24   Beather Delon Gibson, PA  POTASSIUM PO Take 1 tablet by mouth daily.    [provider]  rosuvastatin  (CRESTOR ) 10 MG tablet Take 1 tablet (10 mg total) by mouth daily. 04/10/24   Duanne Butler DASEN, MD    Family History Family History  Problem Relation Age of Onset   Hypertension Mother    Stroke Mother 8   Hypertension Father    Heart attack Father    Colon cancer Brother    Heart attack Paternal Grandmother        ? age   Heart attack Maternal  Uncle    Coronary artery disease Paternal Aunt    Coronary artery disease Paternal Uncle    Heart attack Maternal Grandfather    Diabetes Neg Hx    Colon polyps Neg Hx    Esophageal cancer Neg Hx    Rectal cancer Neg Hx    Stomach cancer Neg Hx     Social History Social History   Tobacco Use   Smoking status: Never   Smokeless tobacco: Never  Vaping Use   Vaping status: Never Used  Substance Use Topics   Alcohol use: No    Alcohol/week: 0.0 standard drinks of alcohol   Drug use: No     Allergies   Erythromycin   Review of Systems Review of Systems  Genitourinary:  Positive for dysuria.     Physical Exam Triage Vital Signs ED Triage Vitals [09/19/24 1646]  Encounter Vitals Group     BP (!) 150/71     Girls Systolic BP Percentile      Girls Diastolic BP Percentile      Boys Systolic BP Percentile      Boys Diastolic BP Percentile      Pulse Rate 88     Resp 16     Temp 98.3 F (36.8 C)     Temp Source Oral     SpO2 94 %     Weight      Height      Head Circumference      Peak  Flow      Pain Score 3     Pain Loc      Pain Education      Exclude from Growth Chart    No data found.  Updated Vital Signs BP (!) 150/71 (BP Location: Left Arm)   Pulse 88   Temp 98.3 F (36.8 C) (Oral)   Resp 16   SpO2 94%   Visual Acuity Right Eye Distance:   Left Eye Distance:   Bilateral Distance:    Right Eye Near:   Left Eye Near:    Bilateral Near:     Physical Exam Constitutional:      Appearance: Normal appearance.  Cardiovascular:     Rate and Rhythm: Normal rate and regular rhythm.  Pulmonary:     Effort: Pulmonary effort is normal.     Breath sounds: Normal breath sounds.  Abdominal:     General: Abdomen is flat. Bowel sounds are normal.     Tenderness: There is no abdominal tenderness. There is no right CVA tenderness or left CVA tenderness.  Neurological:     Mental Status: She is alert.      UC Treatments / Results  Labs (all labs ordered are listed, but only abnormal results are displayed) Labs Reviewed  POCT URINE DIPSTICK - Abnormal; Notable for the following components:      Result Value   Clarity, UA cloudy (*)    Spec Grav, UA <=1.005 (*)    Blood, UA moderate (*)    Nitrite, UA Positive (*)    Leukocytes, UA Large (3+) (*)    All other components within normal limits    EKG   Radiology No results found.  Procedures Procedures (including critical care time)  Medications Ordered in UC Medications - No data to display  Initial Impression / Assessment and Plan / UC Course  I have reviewed the triage vital signs and the nursing notes.  Pertinent labs & imaging results that were available during my care of  the patient were reviewed by me and considered in my medical decision making (see chart for details).     UA c/w uti.    Final Clinical Impressions(s) / UC Diagnoses   Final diagnoses:  Acute cystitis without hematuria   Discharge Instructions   None    ED Prescriptions     Medication Sig Dispense Auth.  Provider   cephALEXin  (KEFLEX ) 500 MG capsule Take 1 capsule (500 mg total) by mouth 2 (two) times daily for 7 days. 14 capsule Richad Jon HERO, NP      PDMP not reviewed this encounter.   Richad Jon HERO, NP 09/19/24 [phone]

## 2024-09-19 NOTE — Telephone Encounter (Signed)
 Patient left message on surgery line this morning before receiving Prolia  injection. Reports urinary frequency and urgency. Requested call back.   Spoke with patient. Patient was seen in office this morning for Prolia , did not mention symptoms while in office, states she was waiting on call back. Advised message was left on surgery line, this is not checked as often through the day as triage line.   Patient reports urinary frequency and voiding small amounts for the past 3 days. Burning with urination, when urine touches skin and itching present. Chronic lower back pain. Denies fever/chills.   I asked patient if she was still in the area, patient states she is; however, she is waiting for her car to be serviced and uncertain how long this will take. States she has already been waiting 2 hrs.   Advised I will review with Tiffany and f/u with recommendations, patient agreeable.   Tiffany -please advise if OV recommended

## 2024-09-19 NOTE — ED Triage Notes (Signed)
 Patient here today with c/o pain in urination, low abd pressure, and urgency since Tuesday.

## 2024-09-19 NOTE — Telephone Encounter (Signed)
 Spoke with patient, advised per Tiffany. Patient agreeable. Patient appreciative of call.   Encounter closed.

## 2024-09-19 NOTE — Telephone Encounter (Signed)
 Urgent care recommended

## 2024-10-20 ENCOUNTER — Ambulatory Visit

## 2024-10-20 DIAGNOSIS — M810 Age-related osteoporosis without current pathological fracture: Secondary | ICD-10-CM | POA: Diagnosis not present

## 2024-11-05 ENCOUNTER — Other Ambulatory Visit: Payer: Self-pay | Admitting: *Deleted

## 2024-11-05 MED ORDER — ROMOSOZUMAB-AQQG 105 MG/1.17ML ~~LOC~~ SOSY
210.0000 mg | PREFILLED_SYRINGE | SUBCUTANEOUS | Status: AC
  Administered 2024-11-21: 210 mg via SUBCUTANEOUS

## 2024-11-07 ENCOUNTER — Telehealth: Payer: Self-pay

## 2024-11-07 NOTE — Telephone Encounter (Signed)
 Evenity  VOB initiated via [email or web address]  Last OV:  Next OV:  Last Evenity  inj: 10/20/24 Next Evenity  inj DUE: 11/20/24

## 2024-11-10 NOTE — Telephone Encounter (Signed)
 Buy/Bill (Office supplied medication)  Out-of-pocket cost due at time of clinic visit: $0  Number of injection/visits approved: ---  Primary: MEDICARE Co-insurance: 0% Admin fee co-insurance: 0%  Secondary: [employer] [place] Co-insurance:   covers the Medicare Part B deductible, co-insurance and 100% of the excess charges Admin fee co-insurance:   Medical Benefit Details: Date Benefits were checked: 11/07/24 Deductible: $0 Met of $283 Required/ Coinsurance: 0%/ Admin Fee: 0%  Prior Auth: N/A PA# Expiration Date:   # of doses approved: -----------------------------------------------------------------------  Patient NOT eligible for Copay Card. Copay Card can make patient's cost as little as $25. Link to apply: https://www.amgensupportplus.com/copay  ** This summary of benefits is an estimation of the patient's out-of-pocket cost. Exact cost may very based on individual plan coverage.

## 2024-11-10 NOTE — Telephone Encounter (Signed)
 Sheryl Huang

## 2024-11-12 NOTE — Telephone Encounter (Signed)
 See referral.   Encounter closed.

## 2024-11-21 ENCOUNTER — Ambulatory Visit (INDEPENDENT_AMBULATORY_CARE_PROVIDER_SITE_OTHER)

## 2024-11-21 DIAGNOSIS — M810 Age-related osteoporosis without current pathological fracture: Secondary | ICD-10-CM | POA: Diagnosis not present

## 2024-11-21 NOTE — Progress Notes (Signed)
 Evenity  injection given SQ left arm and SQ right arm.  Patient tolerated injection well.   Annual exam: 03/27/24 TW Last Bone Density Scan: 04/03/24 Calcium :      9.9     Date: 04/10/24 Hx of Kidney Disease: no Hx of heart attack or stroke in the last year: n

## 2024-12-24 ENCOUNTER — Ambulatory Visit

## 2025-07-01 ENCOUNTER — Ambulatory Visit
